# Patient Record
Sex: Male | Born: 1990 | Race: White | Hispanic: No | Marital: Single | Smoking: Current some day smoker
Health system: Southern US, Community
[De-identification: ages and names within clinical notes are randomized; demographics above are authoritative.]

## PROBLEM LIST (undated history)

## (undated) DIAGNOSIS — R55 Syncope and collapse: Secondary | ICD-10-CM

## (undated) DIAGNOSIS — F200 Paranoid schizophrenia: Secondary | ICD-10-CM

## (undated) DIAGNOSIS — F845 Asperger's syndrome: Secondary | ICD-10-CM

## (undated) DIAGNOSIS — F909 Attention-deficit hyperactivity disorder, unspecified type: Secondary | ICD-10-CM

## (undated) DIAGNOSIS — F32A Depression, unspecified: Secondary | ICD-10-CM

## (undated) DIAGNOSIS — F840 Autistic disorder: Secondary | ICD-10-CM

## (undated) DIAGNOSIS — F419 Anxiety disorder, unspecified: Secondary | ICD-10-CM

## (undated) HISTORY — PX: OTHER SURGICAL HISTORY: SHX169

## (undated) HISTORY — PX: NERVE SURGERY: SHX1016

## (undated) HISTORY — DX: Syncope and collapse: R55

## (undated) HISTORY — DX: Asperger's syndrome: F84.5

## (undated) HISTORY — DX: Attention-deficit hyperactivity disorder, unspecified type: F90.9

---
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
## 2009-11-03 ENCOUNTER — Ambulatory Visit: Payer: Self-pay | Admitting: Internal Medicine

## 2009-11-03 DIAGNOSIS — R21 Rash and other nonspecific skin eruption: Secondary | ICD-10-CM | POA: Insufficient documentation

## 2009-11-03 DIAGNOSIS — Z8619 Personal history of other infectious and parasitic diseases: Secondary | ICD-10-CM | POA: Insufficient documentation

## 2009-11-03 DIAGNOSIS — L0591 Pilonidal cyst without abscess: Secondary | ICD-10-CM | POA: Insufficient documentation

## 2009-11-06 ENCOUNTER — Encounter: Payer: Self-pay | Admitting: Internal Medicine

## 2010-10-03 DIAGNOSIS — F845 Asperger's syndrome: Secondary | ICD-10-CM

## 2010-10-03 HISTORY — DX: Asperger's syndrome: F84.5

## 2010-11-04 NOTE — Letter (Signed)
Summary: Communicable Disease Form/[facility] Dept  Communicable Disease Form/[facility]   Imported By: Lanelle Bal 11/11/2009 13:10:41  _____________________________________________________________________  External Attachment:    Type:   Image     Comment:   External Document

## 2010-11-04 NOTE — Assessment & Plan Note (Signed)
Summary: sores in mouth/kdc   Vital Signs:  Patient profile:   20 year old male Height:      67 inches Weight:      180 pounds BMI:     28.29 Pulse rate:   78 / minute BP sitting:   110 / 68  Vitals Entered By: Dena Billet CC: ov/sores in mouth Comments pt. states cyst on rectum for a yr. and wants to know if it needs to be lanced. pt. also thinks there is a boil x1wk that itches and causes pain pt. noticed 4 or 5 canker sores in moth x 3wks   History of Present Illness: several issues he has a long history of a pilonidal  cyst, it drains clear fluid from time to time  has a  rash close to the anus, for two weeks, extremely itchy  also 3 days ago and noted blister by the  glans in his penis  Allergies: No Known Drug Allergies  Past History:  Past Medical History: h/o  SYNCOPE 2008, was eval at the ER HYPERSOMNIA  ADHD   Social History: Reviewed history from 12/25/2007 and no changes required. live w/parents  finish HS currently working   Review of Systems       denies dysuria, fever or penile discharge he is sexually active, uses a condom  Physical Exam  General:  alert and well-developed.   Rectal:  the skin between his penis and the scrotum is quite red, thick, oozing  clear discharge Genitalia:  he has few clear fluid blisters by the glands in the ventral aspect of his penis.  No penile discharge Skin:  over the area of case citron, he has 3  1mm openings with some clear discharge, no fluctuancy   Impression & Recommendations:  Problem # 1:  several issues first episode of genital herpes: safe sex discussed, prescribed acyclovir, recommend HIV test and RPR rash between the anus and scrotum likely eczema/intertrigo: see Rx  pilonidal  cyst, my recommendation is to see a surgeon for a excision, currently he has no insurance, I will be happy to refer him anyway; to let me know when ready , states needs to d/w parents   Problem # 2:  RASH-NONVESICULAR  (ICD-782.1) see #1  His updated medication list for this problem includes:    Temovate 0.05 % Oint (Clobetasol propionate) .Marland Kitchen... 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 Apply twice a day for 10 days 2)  Acyclovir 200 Mg/73ml Susp (Acyclovir) .... One by mouth every 4 hours for 5 days  Other Orders: Venipuncture ([address]) T-HIV Antibody  (Reflex) ([id number]) T-RPR (Syphilis) ([id number]) Prescriptions: ACYCLOVIR 200 MG/5ML SUSP (ACYCLOVIR) one by mouth every 4 hours for 5 days  #30 x 0   Entered and Authorized by:   Nolon Rod. Paz MD   Signed by:   Nolon Rod. Paz MD on 11/03/2009   Method used:   Print then Give to Patient   RxID:   [id number] TEMOVATE 0.05 % OINT (CLOBETASOL PROPIONATE) apply twice a day for 10 days  #1 x 1   Entered and Authorized by:   Elita Quick E. Paz MD   Signed by:   Nolon Rod. Paz MD on 11/03/2009   Method used:   Print then Give to Patient   RxID:  [id number]  

## 2010-11-04 NOTE — Consult Note (Signed)
Summary: [facility]  [facility]   Imported By: Lanelle Bal 11/12/2009 16:03:54  _____________________________________________________________________  External Attachment:    Type:   Image     Comment:   External Document

## 2010-12-09 ENCOUNTER — Encounter: Payer: Self-pay | Admitting: Internal Medicine

## 2010-12-09 ENCOUNTER — Ambulatory Visit (INDEPENDENT_AMBULATORY_CARE_PROVIDER_SITE_OTHER): Payer: BC Managed Care – PPO | Admitting: Internal Medicine

## 2010-12-09 DIAGNOSIS — L0591 Pilonidal cyst without abscess: Secondary | ICD-10-CM

## 2010-12-10 ENCOUNTER — Encounter: Payer: Self-pay | Admitting: Internal Medicine

## 2010-12-14 NOTE — Assessment & Plan Note (Signed)
Summary: CYST ///SPH   Vital Signs:  Patient profile:   20 year old male Height:      67 inches Weight:      209.50 pounds BMI:     32.93 Pulse rate:   84 / minute Pulse rhythm:   regular BP sitting:   126 / 60  (left arm) Cuff size:   regular  Vitals Entered By: Army Fossa CMA (December 09, 2010 10:27 AM) CC: Pilonidal cyst Comments painful x 3 days [facility]    History of Present Illness:  developed pain  and some discharge  from the pilonidal cyst  3 days ago. discharge is brownish and bloody.  Review of systems  no fever There is no swelling   Current Medications (verified): 1)  None  Allergies (verified): No Known Drug Allergies  Past History:  Past Medical History: Reviewed history from 11/03/2009 and no changes required. h/o  SYNCOPE 2008, was eval at the ER HYPERSOMNIA  ADHD   Past Surgical History: no major surgeries   Physical Exam  General:  alert and well-developed.   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 Will treat current exacerbation with keflex Will refer to surgery for possible excision Patient will call if this  particular exacerbation gets worse, developed more swelling or fever  Orders: T-Culture, Wound ([id number]) Surgical Referral (Surgery)  Complete Medication List: 1)  Keflex 500 Mg Caps (Cephalexin) .Marland Kitchen.. 1 by mouth 4 times x 1 week Prescriptions: KEFLEX 500 MG CAPS (CEPHALEXIN) 1 by mouth 4 times x 1 week  #28 x 0   Entered and Authorized by:   Nolon Rod. Bradely Rudin MD   Signed by:   Nolon Rod. Cadon Raczka MD on 12/09/2010   Method used:   Print then Give to Patient   RxID:   [id number]    Orders Added: 1)  T-Culture, Wound  [[id number]] 2)  Est. Patient Level III [42706] 3)  Surgical Referral [Surgery]

## 2010-12-24 ENCOUNTER — Telehealth: Payer: Self-pay | Admitting: *Deleted

## 2010-12-24 MED ORDER — HYDROCODONE-ACETAMINOPHEN 5-500 MG PO TABS: 1.0000 | ORAL_TABLET | ORAL | Status: AC | PRN

## 2010-12-24 NOTE — Telephone Encounter (Signed)
Pts dad is aware. Sent in meds.

## 2010-12-24 NOTE — Telephone Encounter (Signed)
For pain tylenol or vicodin If pain severe, call vicodin 5mg  1 every 4 hours prn #30, no RF This is not a long term solution

## 2011-01-27 ENCOUNTER — Ambulatory Visit (INDEPENDENT_AMBULATORY_CARE_PROVIDER_SITE_OTHER): Payer: BC Managed Care – PPO | Admitting: Internal Medicine

## 2011-01-27 ENCOUNTER — Encounter: Payer: Self-pay | Admitting: Internal Medicine

## 2011-01-27 DIAGNOSIS — A6000 Herpesviral infection of urogenital system, unspecified: Secondary | ICD-10-CM

## 2011-01-27 DIAGNOSIS — F411 Generalized anxiety disorder: Secondary | ICD-10-CM

## 2011-01-27 DIAGNOSIS — F419 Anxiety disorder, unspecified: Secondary | ICD-10-CM | POA: Insufficient documentation

## 2011-01-27 DIAGNOSIS — F845 Asperger's syndrome: Secondary | ICD-10-CM | POA: Insufficient documentation

## 2011-01-27 DIAGNOSIS — R55 Syncope and collapse: Secondary | ICD-10-CM

## 2011-01-27 MED ORDER — SERTRALINE HCL 50 MG PO TABS: ORAL_TABLET | ORAL | Status: DC

## 2011-01-27 MED ORDER — ALPRAZOLAM 0.25 MG PO TABS: 0.2500 mg | ORAL_TABLET | Freq: Three times a day (TID) | ORAL | Status: DC | PRN

## 2011-01-27 NOTE — Assessment & Plan Note (Signed)
sx consistent with panic attacks. I recommend him to discuss that with his counselor. In addition I think SSRIs are a better option than Xanax. Start Zoloft. We'll prescribe Xanax only temporarily until SSRIs to start working. Followup in one month

## 2011-01-27 NOTE — Assessment & Plan Note (Signed)
Long history of ADHD, recently diagnosed with Asperger by a psychologist. I am concerned about him, he is 51 and has no college education. I think that see a psychiatrist  will be a great idea to see about treatment however the patient states that he has seen psychiatrists before and no particular recommendation was made.

## 2011-01-27 NOTE — Progress Notes (Signed)
  Subjective:    Patient ID: Shawn Berger, male    DOB: [birth year], 20 y.o.   MRN: [id number]  HPI Started on new job last week, developing panic attacks while at work. Symptoms are described as getting nervous, sweats, random thoughts; "like I don't know what to do". Similar symptoms on his 2 previous jobs. Also, recently was diagnosed with Asperger's syndrome.  Past Medical History  Diagnosis Date  . Syncope 2008    was eval at the ER  . ADHD (attention deficit hyperactivity disorder)    No past surgical history on file. History   Social History  . Marital Status: Single    Spouse Name: N/A    Number of Children: N/A  . Years of Education: N/A   Occupational History  . Not on file.   Social History Main Topics  . Smoking status: Not on file  . Smokeless tobacco: Not on file  . Alcohol Use: Not on file  . Drug Use: Not on file  . Sexually Active: Not on file   Other Topics Concern  . Not on file   Social History Narrative   Lives w/ his parents.Marland KitchenMarland KitchenMarland KitchenFinish HS, no college education....Marland KitchenMarland KitchenCurrently working      Review of Systems Denies depression, sleeps well,  other than at work, he has not had panic attacks or generalized anxiety.    Objective:   Physical Exam  Constitutional: He is oriented to person, place, and time. He appears well-developed and well-nourished. No distress.  Neurological: He is alert and oriented to person, place, and time.  Skin: He is not diaphoretic.  Psychiatric: He has a normal mood and affect. His behavior is normal. Judgment and thought content normal.          Assessment & Plan:

## 2011-03-14 ENCOUNTER — Other Ambulatory Visit: Payer: Self-pay | Admitting: Internal Medicine

## 2011-03-14 NOTE — Telephone Encounter (Signed)
LMOM on home # to call andd schedule appt with Dr Paz--(other number is Dad's number)

## 2011-03-14 NOTE — Telephone Encounter (Signed)
Please schedule an appt for pt, unable to fill meds until he has an appt.

## 2011-03-14 NOTE — Telephone Encounter (Signed)
Denied, due for OV

## 2011-03-15 ENCOUNTER — Other Ambulatory Visit: Payer: Self-pay | Admitting: Internal Medicine

## 2011-03-15 NOTE — Telephone Encounter (Signed)
Is this a duplicated? I think I already approved it

## 2011-03-16 ENCOUNTER — Encounter: Payer: Self-pay | Admitting: Internal Medicine

## 2011-03-16 ENCOUNTER — Ambulatory Visit (INDEPENDENT_AMBULATORY_CARE_PROVIDER_SITE_OTHER): Payer: BC Managed Care – PPO | Admitting: Internal Medicine

## 2011-03-16 DIAGNOSIS — F411 Generalized anxiety disorder: Secondary | ICD-10-CM

## 2011-03-16 DIAGNOSIS — F419 Anxiety disorder, unspecified: Secondary | ICD-10-CM

## 2011-03-16 MED ORDER — SERTRALINE HCL 50 MG PO TABS: ORAL_TABLET | ORAL | Status: DC

## 2011-03-16 MED ORDER — ALPRAZOLAM 0.25 MG PO TABS: 0.2500 mg | ORAL_TABLET | Freq: Three times a day (TID) | ORAL | Status: DC | PRN

## 2011-03-16 NOTE — Patient Instructions (Addendum)
Increase sertraline as prescribed, continue xanax as needed Call anytime if symptoms worse , suicidal ideas you need to be seen in a month either here or by a psychiatrist Consider Dr Jennelle Human or Marlyne Beards (see below) We sent #20 xanax today, call if more is needed      [facility] [address] [place], [place] [address]  Phone: [phone]  Fax: [phone]

## 2011-03-16 NOTE — Assessment & Plan Note (Signed)
Followup on anxiety, not doing better, more anxious and slightly depressed. No suicidal. Plan: Increase sertraline to 150 mg, continue with Xanax. He has a history of asperger syndrome, the treatment of his anxiety may be complicated. Recommend to see a psychiatrist. The father states they will call for an appointment, information provided. See instructions.

## 2011-03-16 NOTE — Telephone Encounter (Signed)
Has not been approved since April 26 for # 20.

## 2011-03-16 NOTE — Telephone Encounter (Signed)
Ok #20, needs OV before further RF.

## 2011-03-16 NOTE — Progress Notes (Signed)
  Subjective:    Patient ID: Shawn Berger, male    DOB: Feb 27, 1991, 20 y.o.   MRN: [id number]  HPI Followup from last office visit, here with his father. He developed a panic attack after he  started a new job at [employer].  He was started on sertraline and Xanax. Symptoms are actually worse, anxious not only at his job but also in other settings. They are more frequent. Again described the symptoms as a sudden anxiety associated with feeling hot and sweats. Symptoms last 5 minutes.  Past Medical History  Diagnosis Date  . Syncope 2008    was eval at the ER  . ADHD (attention deficit hyperactivity disorder)    No past surgical history on file.    Review of Systems In the last few weeks he has also developed some depression and feeling the need of crying. Denies suicidal ideas. He told his father that everything is going well in his life and he can't understand why he is feeling this way. Denies the use of alcohol, he smokes tobacco half pack a day, no drugs except occasional marijuana. Last time he smoked pot about a week ago .    Objective:   Physical Exam Alert, oriented, in no apparent distress. Coherent and cooperative. No obvious evidence of anxiety or depression.        Assessment & Plan:

## 2011-03-16 NOTE — Telephone Encounter (Signed)
Pt has appt today

## 2011-03-29 NOTE — Telephone Encounter (Signed)
Patient had appt on 6/13

## 2011-04-11 ENCOUNTER — Other Ambulatory Visit: Payer: Self-pay | Admitting: Internal Medicine

## 2011-07-19 ENCOUNTER — Other Ambulatory Visit: Payer: Self-pay | Admitting: Internal Medicine

## 2011-07-19 MED ORDER — ALPRAZOLAM 0.25 MG PO TABS: 0.2500 mg | ORAL_TABLET | Freq: Three times a day (TID) | ORAL | Status: DC | PRN

## 2011-07-19 MED ORDER — ALPRAZOLAM 0.25 MG PO TABS: 0.2500 mg | ORAL_TABLET | Freq: Three times a day (TID) | ORAL | Status: AC | PRN

## 2011-07-19 NOTE — Telephone Encounter (Signed)
Done

## 2011-07-19 NOTE — Telephone Encounter (Signed)
Ok #20, no RF Also he was recommended to see psychiatry, I won't be able to RF meds long term

## 2011-07-19 NOTE — Telephone Encounter (Signed)
Alprazolam request [last refill 03/16/11 #20x0]

## 2011-07-19 NOTE — Telephone Encounter (Signed)
Addended by: Regis Bill on: 07/19/2011 01:27 PM   Modules accepted: Orders

## 2012-05-07 ENCOUNTER — Other Ambulatory Visit: Payer: Self-pay | Admitting: Internal Medicine

## 2012-05-07 NOTE — Telephone Encounter (Signed)
Pt has not been seen recently...ok to refill?

## 2012-05-07 NOTE — Telephone Encounter (Signed)
Not seen in a year, he was recommended to see psychiatry. Unable to refill at this time.

## 2012-05-07 NOTE — Telephone Encounter (Signed)
ALPRAZOLAM 0.25 MG TABLET  LAST FILLED: 05/07/12 TAKE 1 TABLET 3 TIMES A DAY AS NEEDED FOR ANXIETY

## 2012-05-08 NOTE — Telephone Encounter (Signed)
Tried calling to notify pt but do not have the correct phone #.

## 2015-07-29 ENCOUNTER — Ambulatory Visit: Payer: Self-pay | Admitting: Internal Medicine

## 2015-07-29 ENCOUNTER — Telehealth: Payer: Self-pay | Admitting: Internal Medicine

## 2015-07-29 ENCOUNTER — Other Ambulatory Visit: Payer: Self-pay

## 2015-07-29 NOTE — Telephone Encounter (Signed)
Caller name: Ernie   Relationship to patient: Father   Can be reached: [phone]  Reason for call: Pt's father called in at 12:04 and lvm stating that he is cancelling pt's appt due to his son not being able to get off work. He also wanted to know if Dr. Drue NovelPaz have a 4:00 appt any day before Friday. Returned pt's father's call at 1:15 confirming appt cancellation and also making him aware that provider doesn't have any appts at 4:00 as needed.

## 2015-07-31 NOTE — Telephone Encounter (Signed)
Father called stating he was running 5 minutes late. I went to notify front desk and pt appt had been cancelled. [place] walked back to [facility].

## 2016-03-11 ENCOUNTER — Ambulatory Visit: Payer: Self-pay | Admitting: Internal Medicine

## 2016-03-11 ENCOUNTER — Telehealth: Payer: Self-pay | Admitting: Internal Medicine

## 2016-03-11 NOTE — Telephone Encounter (Signed)
-----   Message from Hampton BaysKaylyn Canter, [place] sent at 03/11/2016  8:18 AM EDT ----- Regarding: RE: Re-Establish (had cancellation for 6/9) He said okay to use 3:30 slot  ----- Message -----    From: Maia PettiesKristie S Ortiz    Sent: 03/11/2016   8:13 AM      To: Conrad BurlingtonKaylyn Canter, CMA Subject: FW: Re-Establish (had cancellation for 6/9)    Can you ask Dr. Drue NovelPaz if this is ok for today? ----- Message -----    From: Maia PettiesKristie S Ortiz    Sent: 03/10/2016   4:05 PM      To: Maia PettiesKristie S Ortiz Subject: FW: Re-Establish (had cancellation for 6/9)      ----- Message -----    From: Maia PettiesKristie S Ortiz    Sent: 03/10/2016   4:04 PM      To: Wanda PlumpJose E Paz, MD Subject: Re-Establish (had cancellation for 6/9)        Is it ok to re-establish Barnett Abuathan Vanhandel, DOB 1991/05/28, MRN [id number] - he is special needs adult child, last OV 03/2011

## 2016-03-14 NOTE — Telephone Encounter (Signed)
Second  No-show, no charge, do not rescheduled

## 2016-03-14 NOTE — Telephone Encounter (Signed)
This is Pt's second no show for re-establish. Charge? No Charge? Allow to re-establish?

## 2016-03-14 NOTE — Telephone Encounter (Signed)
Pt was no show for re-est appt. Pt has not rescheduled. Charge or no charge? Allow to re-est in future?

## 2016-04-13 ENCOUNTER — Ambulatory Visit: Payer: BLUE CROSS/BLUE SHIELD | Admitting: Family Medicine

## 2016-04-28 ENCOUNTER — Encounter: Payer: Self-pay | Admitting: Family Medicine

## 2016-04-28 ENCOUNTER — Other Ambulatory Visit (HOSPITAL_COMMUNITY)
Admission: RE | Admit: 2016-04-28 | Discharge: 2016-04-28 | Disposition: A | Discharge status: Home / Self Care | Payer: BLUE CROSS/BLUE SHIELD | Source: Ambulatory Visit | Attending: Family Medicine | Admitting: Family Medicine

## 2016-04-28 ENCOUNTER — Ambulatory Visit (INDEPENDENT_AMBULATORY_CARE_PROVIDER_SITE_OTHER): Payer: BLUE CROSS/BLUE SHIELD | Admitting: Family Medicine

## 2016-04-28 VITALS — BP 120/72 | HR 84 | Temp 97.7°F | Ht 67.75 in | Wt 152.2 lb

## 2016-04-28 DIAGNOSIS — Z113 Encounter for screening for infections with a predominantly sexual mode of transmission: Secondary | ICD-10-CM | POA: Diagnosis present

## 2016-04-28 DIAGNOSIS — F411 Generalized anxiety disorder: Secondary | ICD-10-CM | POA: Diagnosis not present

## 2016-04-28 DIAGNOSIS — Z8619 Personal history of other infectious and parasitic diseases: Secondary | ICD-10-CM

## 2016-04-28 DIAGNOSIS — F321 Major depressive disorder, single episode, moderate: Secondary | ICD-10-CM | POA: Insufficient documentation

## 2016-04-28 DIAGNOSIS — Z7251 High risk heterosexual behavior: Secondary | ICD-10-CM | POA: Diagnosis not present

## 2016-04-28 LAB — COMPREHENSIVE METABOLIC PANEL
ALT: 13 U/L (ref 0–53)
AST: 13 U/L (ref 0–37)
Albumin: 4.4 g/dL (ref 3.5–5.2)
Alkaline Phosphatase: 59 U/L (ref 39–117)
BILIRUBIN TOTAL: 0.6 mg/dL (ref 0.2–1.2)
BUN: 14 mg/dL (ref 6–23)
CO2: 28 meq/L (ref 19–32)
CREATININE: 0.9 mg/dL (ref 0.40–1.50)
Calcium: 9.6 mg/dL (ref 8.4–10.5)
Chloride: 104 mEq/L (ref 96–112)
GFR: 107.92 mL/min (ref >60.00)
Glucose, Bld: 97 mg/dL (ref 70–99)
Potassium: 4.3 mEq/L (ref 3.5–5.1)
SODIUM: 137 meq/L (ref 135–145)
TOTAL PROTEIN: 7 g/dL (ref 6.0–8.3)

## 2016-04-28 LAB — CBC
HEMATOCRIT: 48.3 % (ref 39.0–52.0)
HEMOGLOBIN: 16.2 g/dL (ref 13.0–17.0)
MCHC: 33.6 g/dL (ref 30.0–36.0)
MCV: 89.5 fl (ref 78.0–100.0)
Platelets: 247 10*3/uL (ref 150.0–400.0)
RBC: 5.4 Mil/uL (ref 4.22–5.81)
RDW: 14 % (ref 11.5–15.5)
WBC: 7.8 10*3/uL (ref 4.0–10.5)

## 2016-04-28 LAB — TSH: TSH: 0.89 u[IU]/mL (ref 0.35–4.50)

## 2016-04-28 MED ORDER — CITALOPRAM HYDROBROMIDE 10 MG PO TABS: 10.0000 mg | ORAL_TABLET | Freq: Every day | ORAL | 3 refills | Status: DC

## 2016-04-28 NOTE — Progress Notes (Signed)
Phone: [phone]  Subjective:  Patient presents today to establish care. Chief complaint-noted.   See problem oriented charting  The following were reviewed and entered/updated in epic: Past Medical History:  Diagnosis Date  . ADHD (attention deficit hyperactivity disorder)   . Asperger syndrome 2012  . Syncope 2008   was eval at the ER   Patient Active Problem List   Diagnosis Date Noted  . Attention deficit hyperactivity disorder (ADHD) 07/04/2007    Priority: High  . Moderate single current episode of major depressive disorder (HCC) 04/28/2016    Priority: Medium  . GAD (generalized anxiety disorder) 04/28/2016    Priority: Medium  . Asperger syndrome 01/27/2011    Priority: Medium  . Anxiety 01/27/2011    Priority: Medium  . History of syphilis 11/03/2009    Priority: Low  . SYMPTOM, SYNCOPE AND COLLAPSE 07/04/2007    Priority: Low   Past Surgical History:  Procedure Laterality Date  . none      Family History  Problem Relation Age of Onset  . Healthy Mother   . Healthy Father   . Healthy Sister   . Vitiligo Brother   . Healthy Sister     Medications- reviewed and updated No current outpatient prescriptions on file.   No current facility-administered medications for this visit.     Allergies-reviewed and updated No Known Allergies  Social History   Social History  . Marital status: Single    Spouse name: N/A  . Number of children: N/A  . Years of education: N/A   Social History Main Topics  . Smoking status: Current Every Day Smoker    Packs/day: 0.50    Types: Cigarettes  . Smokeless tobacco: None  . Alcohol use 0.6 oz/week    1 Shots of liquor per week  . Drug use:     Types: Marijuana  . Sexual activity: Yes    Birth control/ protection: Condom   Other Topics Concern  . None   Social History Narrative   Dating. Lives w/ his parents.       Finished HS, no college education.    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 On feet 10-11 hours.       Hobbies: enjoys time outside, walking around woods for example    ROS--Full ROS was completed Review of Systems  Constitutional: Negative for chills and fever.  HENT: Negative for hearing loss.   Eyes: Negative for blurred vision and double vision.  Respiratory: Negative for cough and hemoptysis.   Cardiovascular: Negative for chest pain and palpitations.  Gastrointestinal: Positive for constipation. Negative for heartburn and nausea.  Genitourinary: Negative for dysuria and urgency.  Musculoskeletal: Negative for myalgias and neck pain.  Skin: Negative for itching and rash.  Neurological: Negative for dizziness, tingling and headaches.  Endo/Heme/Allergies: Negative for polydipsia. Does not bruise/bleed easily.  Psychiatric/Behavioral: Positive for substance abuse (marijuana). Negative for suicidal ideas. The patient is nervous/anxious.     Objective: BP 120/72 (BP Location: Left Arm, Patient Position: Sitting, Cuff Size: Normal)   Pulse 84   Temp 97.7 F (36.5 C) (Oral)   Ht 5' 7.75" (1.721 m)   Wt 152 lb 3.2 oz (69 kg)   SpO2 98%   BMI 23.31 kg/m  Gen: NAD, resting comfortably HEENT: Mucous membranes are moist. Oropharynx normal. TM normal. Eyes: sclera and lids normal, PERRLA Neck: no thyromegaly, no cervical lymphadenopathy CV: RRR no murmurs rubs or gallops  Lungs: CTAB no crackles, wheeze, rhonchi Abdomen: soft/nontender/nondistended/normal bowel sounds. No rebound or guarding.  Ext: no edema Skin: warm, dry Neuro: 5/5 strength in upper and lower extremities, normal gait, normal reflexes  Assessment/Plan:  Eats once a day. Eats 10-11 hours then eats at 8 o clock at night. Feels hungry but eats something but takes effort to swallow it. No true dysphagia. Advised 3 meals a day even if first two are small and work his way up  Tobacco- advised to quit. He may start by trying to vape- quit for 2  years previously  History syphilis treated HD, history unprotected sex though currently using condoms- check STD labs  GAD (generalized anxiety disorder) Depression major S: Anxiety main issue with GAD of 17 and rates very difficult. His anxiety has worsened some from prior noted. No longer on zoloft. Does not remember how much it helped. Not sure about other SSRI but does not think class helped much. No longer seeing psychology or psychiatry. Depression was a concern given sleep issue. Falls to sleep for 4 hours then wakes up, does not wake up refreshed. Feels tired all day. PHQ9 of 15 with no SI/HI.  A/P: update labs. Also start citalopram 10mg  with 4 week follow up . See avs instructions.    Return in about 4 weeks (around 05/26/2016).   Orders Placed This Encounter  Procedures  . CBC    [place]  . Comprehensive metabolic panel    [place]  . TSH    [place]  . RPR    [facility]  . HIV antibody    [facility]    Meds ordered this encounter  Medications  . citalopram (CELEXA) 10 MG tablet    Sig: Take 1 tablet (10 mg total) by mouth daily.    Dispense:  30 tablet    Refill:  3    Return precautions advised.  Tana Conch, MD

## 2016-04-28 NOTE — Progress Notes (Signed)
Pre visit review using our clinic review tool, if applicable. No additional management support is needed unless otherwise documented below in the visit note. 

## 2016-04-28 NOTE — Patient Instructions (Addendum)
Labs before you go  Pee directly into cup without cleaning your penis for this test  Start citalopram 10mg  daily. Follow up 4 weeks. Schedule this today at check out. This is to treat both anxiety and depression  Encourage you to quit smoking. Marijuana can affect depression so I would advise against.   Eat 3 meals a day even if small meal   Taking the medicine as directed and not missing any doses is one of the best things you can do to treat your depression.  Here are some things to keep in mind:  1) Side effects (stomach upset, some increased anxiety) may happen before you notice a benefit.  These side effects typically go away over time. 2) Changes to your dose of medicine or a change in medication all together is sometimes necessary 3) Most people need to be on medication at least 6-12 months 4) Many people will notice an improvement within two weeks but the full effect of the medication can take up to 4-6 weeks 5) Stopping the medication when you start feeling better often results in a return of symptoms 6) If you start having thoughts of hurting yourself or others after starting this medicine, call our office immediately at [phone] or seek care through 911.

## 2016-04-28 NOTE — Assessment & Plan Note (Signed)
Depression major S: Anxiety main issue with GAD of 17 and rates very difficult. His anxiety has worsened some from prior noted. No longer on zoloft. Does not remember how much it helped. Not sure about other SSRI but does not think class helped much. No longer seeing psychology or psychiatry. Depression was a concern given sleep issue. Falls to sleep for 4 hours then wakes up, does not wake up refreshed. Feels tired all day. PHQ9 of 15 with no SI/HI.  A/P: update labs. Also start citalopram 10mg  with 4 week follow up . See avs instructions.

## 2016-04-29 LAB — URINE CYTOLOGY ANCILLARY ONLY
Chlamydia: NEGATIVE
NEISSERIA GONORRHEA: NEGATIVE
TRICH (WINDOWPATH): NEGATIVE

## 2016-04-29 LAB — RPR

## 2016-04-29 LAB — HIV ANTIBODY (ROUTINE TESTING W REFLEX): HIV 1&2 Ab, 4th Generation: NONREACTIVE

## 2016-05-26 ENCOUNTER — Ambulatory Visit (INDEPENDENT_AMBULATORY_CARE_PROVIDER_SITE_OTHER): Payer: BLUE CROSS/BLUE SHIELD | Admitting: Family Medicine

## 2016-05-26 ENCOUNTER — Encounter: Payer: Self-pay | Admitting: Family Medicine

## 2016-05-26 VITALS — BP 110/62 | HR 90 | Temp 98.0°F | Wt 148.0 lb

## 2016-05-26 DIAGNOSIS — F411 Generalized anxiety disorder: Secondary | ICD-10-CM

## 2016-05-26 DIAGNOSIS — F321 Major depressive disorder, single episode, moderate: Secondary | ICD-10-CM

## 2016-05-26 MED ORDER — CITALOPRAM HYDROBROMIDE 20 MG PO TABS: 20.0000 mg | ORAL_TABLET | Freq: Every day | ORAL | 5 refills | Status: DC

## 2016-05-26 NOTE — Progress Notes (Signed)
Pre visit review using our clinic review tool, if applicable. No additional management support is needed unless otherwise documented below in the visit note. 

## 2016-05-26 NOTE — Assessment & Plan Note (Signed)
Depression  GAD S: last visit diagnosed with GAD and depression. GAD7 was 17 and rates 17 which was primary issue. PHQ9 was 15 with no SI/HI.  He had been on zoloft in past but was not sure how much it helped. He thinks may have tried other SSRI. We started patient on citalopram 10mg  with plans for 4 week follow up  Today, both GAD7 and PHQ9 are down to 8. GAD rated as not difficult at all and depression rated somewhat difficult. May be some overlap here as he statesfeeling tired is an issue for him but he told me that fatigue was main SE when starting medicine and now improving  A/P: improving but still poor control, titrate to 20mg  celexa. Hopeful SE of fatigue is tolerable. Return visit 6 weeks. Strict SI precautions given.

## 2016-05-26 NOTE — Patient Instructions (Signed)
Increase celexa to 20mg   Return in 6 weeks  Any thoughts of hurting yourself call us immediately  Thrilled you are feeling better! There is one more potential adjustment to this medication but my hopes are we do not need to do that.

## 2016-05-26 NOTE — Progress Notes (Signed)
Subjective:  Shawn Berger is a 25 y.o. year old very pleasant male patient who presents for/with See problem oriented charting ROS- no SI/HI. Some anxiety, denies recent depressed mood.see any ROS included in HPI as well.   Past Medical History-  Patient Active Problem List   Diagnosis Date Noted  . Attention deficit hyperactivity disorder (ADHD) 07/04/2007    Priority: High  . Moderate single current episode of major depressive disorder (HCC) 04/28/2016    Priority: Medium  . GAD (generalized anxiety disorder) 04/28/2016    Priority: Medium  . Asperger syndrome 01/27/2011    Priority: Medium  . Anxiety 01/27/2011    Priority: Medium  . History of syphilis 11/03/2009    Priority: Low  . SYMPTOM, SYNCOPE AND COLLAPSE 07/04/2007    Priority: Low    Medications- reviewed and updated Current Outpatient Prescriptions  Medication Sig Dispense Refill  . citalopram (CELEXA) 10 MG tablet Take 1 tablet (10 mg total) by mouth daily. 30 tablet 3   No current facility-administered medications for this visit.     Objective: BP 110/62 (BP Location: Left Arm, Patient Position: Sitting, Cuff Size: Normal)   Pulse 90   Temp 98 F (36.7 C) (Oral)   Wt 148 lb (67.1 kg)   SpO2 96%   BMI 22.67 kg/m  Gen: NAD, resting comfortably Non depressed, non axious mood  Assessment/Plan:  GAD (generalized anxiety disorder) Depression  S: last visit diagnosed with GAD and depression. GAD7 was 17 and rates 17 which was primary issue. PHQ9 was 15 with no SI/HI.  He had been on zoloft in past but was not sure how much it helped. He thinks may have tried other SSRI. We started patient on citalopram 10mg  with plans for 4 week follow up  Today, both GAD7 and PHQ9 are down to 8. GAD rated as not difficult at all and depression rated somewhat difficult. May be some overlap here as he statesfeeling tired is an issue for him but he told me that fatigue was main SE when starting medicine and now  improving  A/P: improving but still poor control, titrate to 20mg  celexa. Hopeful SE of fatigue is tolerable. Return visit 6 weeks. Strict SI precautions given.   6 weeks  Meds ordered this encounter  Medications  . citalopram (CELEXA) 20 MG tablet    Sig: Take 1 tablet (20 mg total) by mouth daily.    Dispense:  30 tablet    Refill:  5   Return precautions advised.  Tana ConchStephen Januel Doolan, MD

## 2016-07-11 ENCOUNTER — Encounter: Payer: Self-pay | Admitting: Family Medicine

## 2016-07-11 ENCOUNTER — Ambulatory Visit (INDEPENDENT_AMBULATORY_CARE_PROVIDER_SITE_OTHER): Payer: BLUE CROSS/BLUE SHIELD | Admitting: Family Medicine

## 2016-07-11 VITALS — BP 128/70 | HR 99 | Temp 98.2°F | Wt 156.0 lb

## 2016-07-11 DIAGNOSIS — F321 Major depressive disorder, single episode, moderate: Secondary | ICD-10-CM | POA: Diagnosis not present

## 2016-07-11 DIAGNOSIS — F411 Generalized anxiety disorder: Secondary | ICD-10-CM

## 2016-07-11 DIAGNOSIS — J36 Peritonsillar abscess: Secondary | ICD-10-CM

## 2016-07-11 MED ORDER — AMOXICILLIN-POT CLAVULANATE 875-125 MG PO TABS: 1.0000 | ORAL_TABLET | Freq: Two times a day (BID) | ORAL | 0 refills | Status: DC

## 2016-07-11 MED ORDER — CITALOPRAM HYDROBROMIDE 40 MG PO TABS: 40.0000 mg | ORAL_TABLET | Freq: Every day | ORAL | 5 refills | Status: DC

## 2016-07-11 NOTE — Patient Instructions (Addendum)
Increase celexa to 40mg . Follow up 6 weeks. If thoughts of hurting yourself- contact us immediately  I am concerned for peritonsilar abscess with swelling in soft tissues near your tonsil and pus that is evident only on left side. I am treating you for 2 weeks with augmentin. I want you to come back for a recheck Thursday at 11 30 AM if possible

## 2016-07-11 NOTE — Assessment & Plan Note (Signed)
Depression S: GAD7 17 and PH9 of 15 initially down to 8 for both on last visit with celexa 10mg . Further titrated to 20mg  and PHQ9 of 6 today and GAD7 down to 5. Would like further improvement in symptoms. He admits fatigue issues could be linked to prior diagnosis of narcolepsy at age 25 apparently - he has never been on treatment. No worsening of fatigue on the 20mg  celexa which was prior concern of his at 10mg .  A/P:  Titrate to 40mg . No SI- return precautions given in regards to this. Follow up 6 weeks.

## 2016-07-11 NOTE — Progress Notes (Signed)
Pre visit review using our clinic review tool, if applicable. No additional management support is needed unless otherwise documented below in the visit note. 

## 2016-07-11 NOTE — Progress Notes (Signed)
Subjective:  Shawn Berger is a 25 y.o. year old very pleasant male patient who presents for/with See problem oriented charting ROS- no fever, chills. Has had some nausea and severe sore throat on left only. .see any ROS included in HPI as well.   Past Medical History-  Patient Active Problem List   Diagnosis Date Noted  . Attention deficit hyperactivity disorder (ADHD) 07/04/2007    Priority: High  . Moderate single current episode of major depressive disorder (HCC) 04/28/2016    Priority: Medium  . GAD (generalized anxiety disorder) 04/28/2016    Priority: Medium  . Asperger syndrome 01/27/2011    Priority: Medium  . Anxiety 01/27/2011    Priority: Medium  . History of syphilis 11/03/2009    Priority: Low  . SYMPTOM, SYNCOPE AND COLLAPSE 07/04/2007    Priority: Low    Medications- reviewed and updated Current Outpatient Prescriptions  Medication Sig Dispense Refill  . citalopram (CELEXA) 40 MG tablet Take 1 tablet (40 mg total) by mouth daily. 30 tablet 5  . amoxicillin-clavulanate (AUGMENTIN) 875-125 MG tablet Take 1 tablet by mouth 2 (two) times daily. 28 tablet 0   No current facility-administered medications for this visit.     Objective: BP 128/70 (BP Location: Left Arm, Patient Position: Sitting, Cuff Size: Large)   Pulse 99   Temp 98.2 F (36.8 C) (Oral)   Wt 156 lb (70.8 kg)   SpO2 97%   BMI 23.90 kg/m  Gen: NAD, resting comfortably Poor dentition but gumline appears clean. There is swelling of soft palate on left. The uvula is to the right of midline. Tonsil visibly enlarged on right,erythematous and with central purulent material.  CV: RRR no murmurs rubs or gallops Lungs: CTAB no crackles, wheeze, rhonchi Abdomen: soft/nontender/nondistended/normal bowel sounds.  Ext: no edema Skin: warm, dry Neuro: grossly normal, moves all extremities      Assessment/Plan:   GAD (generalized anxiety disorder) Depression S: GAD7 17 and PH9 of 15 initially  down to 8 for both on last visit with celexa 10mg . Further titrated to 20mg  and PHQ9 of 6 today and GAD7 down to 5. Would like further improvement in symptoms. He admits fatigue issues could be linked to prior diagnosis of narcolepsy at age 49 apparently - he has never been on treatment. No worsening of fatigue on the 20mg  celexa which was prior concern of his at 10mg .  A/P:  Titrate to 40mg . No SI- return precautions given in regards to this. Follow up 6 weeks.   Peritonsilar abscess S: deep gum cleaning about a week ago and since that time has had progressive left sided throat pain which has made it hard to eat almost anything and also hard to drink. Hot or cold both bother him. afebrile A/P: Appears to be potential abscess on exam. Cover 14 days of augmentin with follow up after 48 hours- will also call dentist. If no improvement may consider oral surgery referral for I+D. This will also cover strep but given bulge in soft palate and deviation of uvula slightly- strongly suspect this is abscess and not just strep throat related.   Meds ordered this encounter  Medications  . citalopram (CELEXA) 40 MG tablet    Sig: Take 1 tablet (40 mg total) by mouth daily.    Dispense:  30 tablet    Refill:  5  . amoxicillin-clavulanate (AUGMENTIN) 875-125 MG tablet    Sig: Take 1 tablet by mouth 2 (two) times daily.    Dispense:  28 tablet    Refill:  0  abscess high risk with close follow up advised  Return precautions advised.  Tana ConchStephen Hunter, MD

## 2016-07-14 ENCOUNTER — Ambulatory Visit: Payer: BLUE CROSS/BLUE SHIELD | Admitting: Family Medicine

## 2016-08-19 ENCOUNTER — Encounter: Payer: Self-pay | Admitting: Family Medicine

## 2016-08-19 ENCOUNTER — Ambulatory Visit (INDEPENDENT_AMBULATORY_CARE_PROVIDER_SITE_OTHER): Payer: BLUE CROSS/BLUE SHIELD | Admitting: Family Medicine

## 2016-08-19 DIAGNOSIS — F411 Generalized anxiety disorder: Secondary | ICD-10-CM

## 2016-08-19 DIAGNOSIS — L0501 Pilonidal cyst with abscess: Secondary | ICD-10-CM | POA: Insufficient documentation

## 2016-08-19 MED ORDER — CITALOPRAM HYDROBROMIDE 40 MG PO TABS: 40.0000 mg | ORAL_TABLET | Freq: Every day | ORAL | 2 refills | Status: DC

## 2016-08-19 NOTE — Progress Notes (Signed)
Pre visit review using our clinic review tool, if applicable. No additional management support is needed unless otherwise documented below in the visit note. 

## 2016-08-19 NOTE — Progress Notes (Signed)
Subjective:  Shawn Berger is a 25 y.o. year old very pleasant male patient who presents for/with See problem oriented charting ROS- No Si/HI. No fever, chills. Did have some discharge from abscess- shrinking in size now.see any ROS included in HPI as well.   Past Medical History-  Patient Active Problem List   Diagnosis Date Noted  . Attention deficit hyperactivity disorder (ADHD) 07/04/2007    Priority: High  . Moderate single current episode of major depressive disorder (HCC) 04/28/2016    Priority: Medium  . GAD (generalized anxiety disorder) 04/28/2016    Priority: Medium  . Asperger syndrome 01/27/2011    Priority: Medium  . Anxiety 01/27/2011    Priority: Medium  . History of syphilis 11/03/2009    Priority: Low  . SYMPTOM, SYNCOPE AND COLLAPSE 07/04/2007    Priority: Low  . Pilonidal cyst with abscess 08/19/2016    Medications- reviewed and updated Current Outpatient Prescriptions  Medication Sig Dispense Refill  . citalopram (CELEXA) 40 MG tablet Take 1 tablet (40 mg total) by mouth daily. 90 tablet 2   No current facility-administered medications for this visit.     Objective: BP 112/62 (BP Location: Left Arm, Patient Position: Sitting, Cuff Size: Large)   Pulse 85   Temp 98.2 F (36.8 C) (Oral)   Wt 167 lb 6.4 oz (75.9 kg)   SpO2 97%   BMI 25.64 kg/m  Gen: NAD, resting comfortably CV: RRR no murmurs rubs or gallops Lungs: CTAB no crackles, wheeze, rhonchi Abdomen: soft/nontender/nondistended/normal bowel sounds. No rebound or guarding.  Rectal: pit near top of buttocks crease- then has some induration without obvious fluctuance down at least 3 cm connecting with a 3 x 3 cm abscess without obvious fluctuance- no expanding redness  Assessment/Plan:   Pilonidal cyst with abscess S:pilonidal cyst started bothering him teenage years. Had opted out of surgery before. Been going on for a week at least- had a friend "pop it" with "sterilized thumb tack". Oozed  for a few days and finally stopped yesterday. Improving at this point.  A/P: refer to surgery- Wants to discuss excision option. Apparently had been to [facility] years ago- opted out due to 50% recurrence risk per patient- he now has abscess formation every 4-5 months and would like to consider surgery again. Advised to come in and not used "sterilized" objects at home. Abscess now but improving per patient so we will monitor only  GAD (generalized anxiety disorder) S: feels much better on the celexa 40mg . Still some fatigue with it he thinks though. PHQ9 and GAD7 of 2 with no SI/HI.  A/P: excellent control- continue current meds- 6 month cpe advised. Changed to 90 day supply   Orders Placed This Encounter  Procedures  . Ambulatory referral to General Surgery    Referral Priority:   Routine    Referral Type:   Surgical    Referral Reason:   Specialty Services Required    Requested Specialty:   General Surgery    Number of Visits Requested:   1    Meds ordered this encounter  Medications  . citalopram (CELEXA) 40 MG tablet    Sig: Take 1 tablet (40 mg total) by mouth daily.    Dispense:  90 tablet    Refill:  2    Return precautions advised.  Tana ConchStephen Ebonie Westerlund, MD

## 2016-08-19 NOTE — Assessment & Plan Note (Signed)
S:pilonidal cyst started bothering him teenage years. Had opted out of surgery before. Been going on for a week at least- had a friend "pop it" with "sterilized thumb tack". Oozed for a few days and finally stopped yesterday. Improving at this point.  A/P: refer to surgery- Wants to discuss excision option. Apparently had been to [facility] years ago- opted out due to 50% recurrence risk per patient- he now has abscess formation every 4-5 months and would like to consider surgery again. Advised to come in and not used "sterilized" objects at home. Abscess now but improving per patient so we will monitor only

## 2016-08-19 NOTE — Patient Instructions (Signed)
We will call you within a week about your referral to surgery. If you do not hear within 2 weeks, give us a call.   If new or worsening symptoms- return to see us. Opted to watch given you are doing better.   Sent in 90 day prescription for celexa 40mg   How about we do physical in 6 months to check in

## 2016-08-20 NOTE — Assessment & Plan Note (Signed)
S: feels much better on the celexa 40mg . Still some fatigue with it he thinks though. PHQ9 and GAD7 of 2 with no SI/HI.  A/P: excellent control- continue current meds- 6 month cpe advised. Changed to 90 day supply

## 2016-08-23 ENCOUNTER — Ambulatory Visit: Payer: BLUE CROSS/BLUE SHIELD | Admitting: Family Medicine

## 2018-06-01 ENCOUNTER — Ambulatory Visit: Payer: BLUE CROSS/BLUE SHIELD | Admitting: Family Medicine

## 2018-06-07 ENCOUNTER — Encounter: Payer: Self-pay | Admitting: Family Medicine

## 2018-06-07 ENCOUNTER — Other Ambulatory Visit (HOSPITAL_COMMUNITY)
Admission: RE | Admit: 2018-06-07 | Discharge: 2018-06-07 | Disposition: A | Discharge status: Home / Self Care | Payer: Self-pay | Source: Ambulatory Visit | Attending: Family Medicine | Admitting: Family Medicine

## 2018-06-07 ENCOUNTER — Ambulatory Visit (INDEPENDENT_AMBULATORY_CARE_PROVIDER_SITE_OTHER): Payer: Self-pay | Admitting: Family Medicine

## 2018-06-07 VITALS — BP 136/72 | HR 93 | Temp 97.9°F | Wt 163.8 lb

## 2018-06-07 DIAGNOSIS — R5383 Other fatigue: Secondary | ICD-10-CM

## 2018-06-07 DIAGNOSIS — Z118 Encounter for screening for other infectious and parasitic diseases: Secondary | ICD-10-CM | POA: Insufficient documentation

## 2018-06-07 DIAGNOSIS — Z113 Encounter for screening for infections with a predominantly sexual mode of transmission: Secondary | ICD-10-CM | POA: Insufficient documentation

## 2018-06-07 DIAGNOSIS — Z1159 Encounter for screening for other viral diseases: Secondary | ICD-10-CM

## 2018-06-07 DIAGNOSIS — F329 Major depressive disorder, single episode, unspecified: Secondary | ICD-10-CM

## 2018-06-07 DIAGNOSIS — Z818 Family history of other mental and behavioral disorders: Secondary | ICD-10-CM

## 2018-06-07 DIAGNOSIS — G4710 Hypersomnia, unspecified: Secondary | ICD-10-CM

## 2018-06-07 DIAGNOSIS — R4589 Other symptoms and signs involving emotional state: Secondary | ICD-10-CM

## 2018-06-07 DIAGNOSIS — G47419 Narcolepsy without cataplexy: Secondary | ICD-10-CM | POA: Insufficient documentation

## 2018-06-07 DIAGNOSIS — F419 Anxiety disorder, unspecified: Secondary | ICD-10-CM

## 2018-06-07 DIAGNOSIS — Z114 Encounter for screening for human immunodeficiency virus [HIV]: Secondary | ICD-10-CM

## 2018-06-07 LAB — COMPREHENSIVE METABOLIC PANEL
ALBUMIN: 3.9 g/dL (ref 3.5–5.2)
ALK PHOS: 88 U/L (ref 39–117)
ALT: 449 U/L — AB (ref 0–53)
AST: 212 U/L — ABNORMAL HIGH (ref 0–37)
BILIRUBIN TOTAL: 0.5 mg/dL (ref 0.2–1.2)
BUN: 12 mg/dL (ref 6–23)
CO2: 31 meq/L (ref 19–32)
Calcium: 9.2 mg/dL (ref 8.4–10.5)
Chloride: 103 mEq/L (ref 96–112)
Creatinine, Ser: 0.86 mg/dL (ref 0.40–1.50)
GFR: 111.99 mL/min (ref >60.00)
Glucose, Bld: 90 mg/dL (ref 70–99)
Potassium: 4.1 mEq/L (ref 3.5–5.1)
Sodium: 139 mEq/L (ref 135–145)
Total Protein: 6.8 g/dL (ref 6.0–8.3)

## 2018-06-07 LAB — CBC
HCT: 41.1 % (ref 39.0–52.0)
HEMOGLOBIN: 14.3 g/dL (ref 13.0–17.0)
MCHC: 34.8 g/dL (ref 30.0–36.0)
MCV: 84.4 fl (ref 78.0–100.0)
PLATELETS: 217 10*3/uL (ref 150.0–400.0)
RBC: 4.87 Mil/uL (ref 4.22–5.81)
RDW: 13.5 % (ref 11.5–15.5)
WBC: 6.4 10*3/uL (ref 4.0–10.5)

## 2018-06-07 LAB — TSH: TSH: 2.03 u[IU]/mL (ref 0.35–4.50)

## 2018-06-07 NOTE — Progress Notes (Addendum)
Subjective:  Shawn Berger is a 27 y.o. year old very pleasant male patient who presents for/with See problem oriented charting ROS-Review of Systems  Constitutional: Negative for chills and fever.  HENT: Negative for hearing loss.   Eyes: Negative for blurred vision and double vision.  Respiratory: Negative for cough.   Cardiovascular: Negative for chest pain and palpitations.  Gastrointestinal: Negative for nausea.  Genitourinary: Negative for dysuria.  Psychiatric/Behavioral: Positive for depression. Negative for memory loss. The patient is nervous/anxious.    Past Medical History-  Patient Active Problem List   Diagnosis Date Noted  . Narcolepsy 06/07/2018    Priority: High  . Attention deficit hyperactivity disorder (ADHD) 07/04/2007    Priority: High  . Moderate single current episode of major depressive disorder (HCC) 04/28/2016    Priority: Medium  . GAD (generalized anxiety disorder) 04/28/2016    Priority: Medium  . Asperger syndrome 01/27/2011    Priority: Medium  . Anxiety 01/27/2011    Priority: Medium  . History of syphilis 11/03/2009    Priority: Low  . SYMPTOM, SYNCOPE AND COLLAPSE 07/04/2007    Priority: Low  . Pilonidal cyst with abscess 08/19/2016    Medications- reviewed and updated No current outpatient medications on file.   No current facility-administered medications for this visit.     Objective: BP 136/72   Pulse 93   Temp 97.9 F (36.6 C) (Oral)   Wt 163 lb 12.8 oz (74.3 kg)   SpO2 97%   BMI 25.09 kg/m  Gen: NAD, resting comfortably CV: RRR no murmurs rubs or gallops Lungs: CTAB no crackles, wheeze, rhonchi Abdomen: soft/nontender/nondistended/normal bowel sounds.   Ext: no edema Skin: warm, dry Depressed mood noted. No pressured speech.   Assessment/Plan:  Excessive sleepiness Depressed mood but also with concern for manic episodes (2 weeks of using methamphetamine and incredibly high sex drive)  S: Patient has dealt with  anxiety and depressed mood in the past. Starting within last month has felt incredibly down- has days where he feels ok but for the most part feels depressed. Feels like motivation is completely drained. Has seemed irritable lately. He is worried about possible bipolar. No thoughts of self harm at this time. Mother also concerned as patient has an Uncle with bipolar. He did not regularly take celexa last year.   He lost his job because he had a bad "narcolepsy episode"- a month ago. He was diagnosed with narcolepsy at age 81- fell asleep at work for 7 hours while on the toilet. He lost his job because of this. 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 He was told he needed documentation of narcolepsy likely for future employment. He does not drive but family is supportive and helps him drive. He is worried they are going to cut off their support if he continues to struggle.   When I asked about drug use he initially admits to some use of marijuana. 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 Does not want me to discuss with family. He admits he had a binge of basically 2 weeks using this or pursuing sexual relations with extremely high desire. He admits to unprotected sex in this time.   He states he feels he is 10 years behind his peers in everything. With his Asperger's he feels he has not built many meaningful relationships. He does have one friend who apparently used methamphetamine in past but has been clean for 3 years and wants to help  him. He initially found acceptance in drug use as he had people he could hang out with- but he has realized if he is not using- the same people don't want to hang out and he is disappointed. With his narcolepsy the alertness of the methamphetamine really helps him feel more normal. He asks about restarting ADD meds (I would not advise at this time due to recent drug use) A/P: This is a tough situation- 27 year old with Asperger's, recent  substance abuse, sexual promiscuity, narcolepsy, ADD, history of depressed mood. Mood disorder questionaire with 7 answers for yes and states several of these have happened in same period of time- states moderate problem. Also PHQ9 of 13 and GAD7 elevated at 17. Fortunately no SI  For narcolepsy- he needs updated sleep evaluation so will refer to Dr. Vickey Huger. I wonder if him his difficulties with narcolepsy worsen his mood. He asks about ADD meds but with methamphetamine use I advised against for now  With substance abuse and sexual promiscuity binge- I am concerned about possible Bipolar. Will refer to Dr. Rene Kocher. Patient traveling from [place] and unfortunately Im not sure of local resources for him there but I told him I had full trust in Dr. Rene Kocher- patient may need family support here as I noted at end of visit states self pay in insurance. May need to use option like [facility] or similar in [place] area. I also advised him to seek out NA meetings given substance use- he seems somewhat hesitant  With symptoms updated labs and noted high LFT elevation Lab Results  Component Value Date   ALT 449 (H) 06/07/2018   AST 212 (H) 06/07/2018   ALKPHOS 88 06/07/2018   BILITOT 0.5 06/07/2018  I wonder if methamphetamine use could cause this as no other meds and no liver history  Will update STD testing given unprotected sex. He asks about PREP at end of visit and we can consider this but need to further understand LFT elevation first.  Advised 1-2 week follow up   Lab/Order associations: Excessive sleepiness - Plan: Ambulatory referral to Neurology  Fatigue, unspecified type - Plan: CBC, Comprehensive metabolic panel, TSH  Depressed mood - Plan: Ambulatory referral to Psychiatry  Anxiety - Plan: Ambulatory referral to Psychiatry  Family history of bipolar disorder - Plan: Ambulatory referral to Psychiatry  Need for hepatitis B screening test - Plan: Hepatitis B surface antigen, Hepatitis B  surface antibody,qualitative, Hepatitis B core antibody, total  Encounter for screening for HIV - Plan: HIV antibody  Screening for STD (sexually transmitted disease) - Plan: RPR, Urine cytology ancillary only, Urine cytology ancillary only  Screening for gonorrhea - Plan: Urine cytology ancillary only  Screening for chlamydial disease - Plan: Urine cytology ancillary only  Screening examination for venereal disease - Plan: RPR  Time Stamp The duration of face-to-face time during this visit was greater than 40 minutes (1:45 PM to 2:46 PM). Greater than 50% of this time was spent in counseling, explanation of diagnosis, planning of further management, and/or coordination of care including counseling on his stressors, counseling on need to abstain from methamphetamine and risks to his health, discussing reasons for referrals vs treatment form our office, risks of unprotected sex.    Return precautions advised.  Tana Conch, MD

## 2018-06-07 NOTE — Patient Instructions (Addendum)
We will call you within two weeks about your referral to Dr. Vickey Huger of neurology for narcolepsy evaluation . If you do not hear within 3 weeks, give [place] a call.   Team- please get him a list of psychiatry options. I want you to try to get in ASAP for evaluation- due to concern for possible Bipolar. I think  health would be a good option.   If you have thoughts of self harm please contact us or call 911 immediately  Please stop by lab before you go  1-2 week check in to see how you are doing and where you are at in this process

## 2018-06-08 ENCOUNTER — Telehealth: Payer: Self-pay | Admitting: Family Medicine

## 2018-06-08 ENCOUNTER — Other Ambulatory Visit: Payer: Self-pay

## 2018-06-08 DIAGNOSIS — B20 Human immunodeficiency virus [HIV] disease: Secondary | ICD-10-CM

## 2018-06-08 LAB — HEPATITIS B SURFACE ANTIGEN: Hepatitis B Surface Ag: NONREACTIVE

## 2018-06-08 LAB — HIV-1/2 AB - DIFFERENTIATION
HIV-1 antibody: POSITIVE — AB
HIV-2 Ab: UNDETERMINED — AB

## 2018-06-08 LAB — HIV ANTIBODY (ROUTINE TESTING W REFLEX): HIV 1&2 Ab, 4th Generation: REACTIVE — AB

## 2018-06-08 LAB — HEPATITIS B SURFACE ANTIBODY,QUALITATIVE: Hep B S Ab: REACTIVE — AB

## 2018-06-08 LAB — URINE CYTOLOGY ANCILLARY ONLY
CHLAMYDIA, DNA PROBE: NEGATIVE
Neisseria Gonorrhea: NEGATIVE

## 2018-06-08 LAB — HEPATITIS B CORE ANTIBODY, TOTAL: Hep B Core Total Ab: NONREACTIVE

## 2018-06-08 LAB — RPR: RPR Ser Ql: NONREACTIVE

## 2018-06-08 NOTE — Telephone Encounter (Signed)
See note.  Copied from CRM [id number]. Topic: Inquiry >> Jun 08, 2018  4:44 PM Maia Petties wrote: Reason for CRM: pt asking if it's possible to get ID doctor in [facility] or [place] area. He is wanting a recommendation from Dr. Durene Cal.

## 2018-06-11 ENCOUNTER — Telehealth (HOSPITAL_COMMUNITY): Payer: Self-pay | Admitting: Psychiatry

## 2018-06-11 NOTE — Telephone Encounter (Signed)
D:  Lea @ [facility] called to refer pt for MH-IOP.  A:  Placed call to pt to orient him.  According to pt, he doesn't want groups.  "I don't want to talk about my personal issues in a group."  Pt is requesting a therapist in [place] area.  Encouraged pt to contact his insurance company re: approved providers.  Pt states he doesn't have any insurance.  Mentioned to pt that he could complete a patient assistance application for [facility] services or he could google therapists in his area.  Pt reports he already knows of some therapists there and can call them.  Informed Lea.  R:  Pt receptive.

## 2018-06-11 NOTE — Telephone Encounter (Signed)
I dont have a problem with that- does he know of a doctor in that area that can help him? Could he or his parents help research (if he is open to discussing with parents of course).

## 2018-06-14 NOTE — Telephone Encounter (Signed)
Spoke to pt, told him calling back in regards to ID doctor in [place] or [place] area. Told him Dr. Durene CalHunter does not have a problem with you seeing someone in that area but he does not know any providers. Do you know of any? Pt said he got it all worked out. He went to the [facility] yesterday in [place] and they are helping him get set up with a provider and appointments. Told pt okay I will let Dr. Durene CalHunter know and if you need anything from us please call. Pt verbalized understanding.

## 2019-02-13 ENCOUNTER — Encounter: Payer: Self-pay | Admitting: Family Medicine

## 2019-02-13 ENCOUNTER — Ambulatory Visit (INDEPENDENT_AMBULATORY_CARE_PROVIDER_SITE_OTHER): Payer: Self-pay

## 2019-02-13 ENCOUNTER — Telehealth: Payer: Self-pay

## 2019-02-13 ENCOUNTER — Ambulatory Visit (INDEPENDENT_AMBULATORY_CARE_PROVIDER_SITE_OTHER): Payer: Self-pay | Admitting: Family Medicine

## 2019-02-13 VITALS — Ht 67.75 in | Wt 177.0 lb

## 2019-02-13 DIAGNOSIS — R7612 Nonspecific reaction to cell mediated immunity measurement of gamma interferon antigen response without active tuberculosis: Secondary | ICD-10-CM

## 2019-02-13 DIAGNOSIS — Z21 Asymptomatic human immunodeficiency virus [HIV] infection status: Secondary | ICD-10-CM | POA: Insufficient documentation

## 2019-02-13 DIAGNOSIS — G47419 Narcolepsy without cataplexy: Secondary | ICD-10-CM

## 2019-02-13 NOTE — Patient Instructions (Signed)
There are no preventive care reminders to display for this patient.  Depression screen PHQ 2/9 06/07/2018  Decreased Interest 1  Down, Depressed, Hopeless 2  PHQ - 2 Score 3  Altered sleeping 3  Tired, decreased energy 3  Change in appetite 3  Trouble concentrating 1  Moving slowly or fidgety/restless 0  Suicidal thoughts 0  PHQ-9 Score 13  Difficult doing work/chores Extremely dIfficult

## 2019-02-13 NOTE — Telephone Encounter (Signed)
Reason for CRM: dad calling to ask a question concerning pt.  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  Pt is staying with his parents, and they are concerned if they should be concerned to be around the pt. They are wanting to ask if pt could get a chest xray to confirm this is positive. They have been waiting to hear back from the health dept 3 days, but still have not. Mom is on the DPR and advised dad we could speak with her about the pt, and he did confirm pt could do a virtual visit if Dr Durene Cal can. Please call back thanks  Mr wride [phone]    I called pt and placed him on the schedule for today.

## 2019-02-13 NOTE — Progress Notes (Signed)
Phone [phone]   Subjective:  Virtual visit via Video note. Chief complaint: Chief Complaint  Patient presents with  . TB concerns    This visit type was conducted due to national recommendations for restrictions regarding the COVID-19 Pandemic (e.g. social distancing).  This format is felt to be most appropriate for this patient at this time balancing risks to patient and risks to population by having him in for in person visit.  No physical exam was performed (except for noted visual exam or audio findings with Telehealth visits).    Our team/I connected with Sherlie BanNathan G Bashore on 02/13/19 at 10:40 AM EDT by a video enabled telemedicine application ([email or web address]) and verified that I am speaking with the correct person using two identifiers.  Location patient: Home-[facility] Location provider: [facility], office Persons participating in the virtual visit:  patient  Our team/I discussed the limitations of evaluation and management by telemedicine and the availability of in person appointments. In light of current covid-19 pandemic, patient also understands that we are trying to protect them by minimizing in office contact if at all possible.  The patient expressed consent for telemedicine visit and agreed to proceed. Patient understands insurance will be billed.   ROS- no fever, chills, cough, new fatigue, hemoptysis   Past Medical History-  Patient Active Problem List   Diagnosis Date Noted  . Narcolepsy 06/07/2018    Priority: High  . Attention deficit hyperactivity disorder (ADHD) 07/04/2007    Priority: High  . Moderate single current episode of major depressive disorder (HCC) 04/28/2016    Priority: Medium  . GAD (generalized anxiety disorder) 04/28/2016    Priority: Medium  . Asperger syndrome 01/27/2011    Priority: Medium  . Anxiety 01/27/2011    Priority: Medium  . History of syphilis 11/03/2009    Priority: Low  . SYMPTOM, SYNCOPE AND COLLAPSE 07/04/2007    Priority: Low  .  Pilonidal cyst with abscess 08/19/2016    Medications- reviewed and updated Current Outpatient Medications  Medication Sig Dispense Refill  . bictegravir-emtricitabine-tenofovir AF (BIKTARVY) 50-200-25 MG TABS tablet Take 1 tablet by mouth daily.     No current facility-administered medications for this visit.      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  Was not able to get sleep study (excessive sleepiness)  or evaluation from psychiatry (promiscuous sexual behavior and concern for bipoloar or other disorder)  Diangosed with HIV here in September but had moved to [place]- tried to get him into ID but he did not have insurance. Apparently through help of [facility]- has been able to get care through a family physician who manages [place] Eugene Ryenolds, MD. For his HIV - he ison biktarvy. He reports undetectable HIV levels back in January.compliant with medication.    He was in jail for a month and had a TB test- and was told everything was fine. Had physical and bloodwork done last week and was told he had tuberculosis potentially due to positive  quantiferon  gold test. May  be going into rehab program in [place] within the next few weeks.   No cough or blood in sputum. Wonders about getting x-ray. Tb skin test in march then had quanterferon gold test. Has not heard back from them.  A/P: Patient with positive quantiferon gold test- he is HIV positive. He is asymptomtaic but with HIV could still have active TB without symptoms. Recommend he follow up  with [place] or [place] health department- particularly with him being uninsured due to cost of x-ray. He is with his mother who really wants him to have x-ray today if at all possible (they have been with him for a week and have been exposed- recommend they wear masks and seek care with their physician- discussed active TB obviously would be the risk here). Mother is going to help him cover x-ray cost and he will come out for visit today- will come through [facility] and Violeta Gelinas will escort him to x-ray (she will wear n95) - advised contact with local HD once again ([place] potentially with [place] 1.5 hours away). He is also considering getting set up with [facility] through [facility]- I pressed again on health department.  - they want to do x-ray today as they have had a hard time getting in contact with clinic that told him about positive quantiferon Gold.   - for excessive sleepiness was diagnosed with narcolepsy as child- he is  Trying to get medicaid and can refer back to Dr. Vickey Huger or someone in [place] once things stabilized- first job loss that started all this started with an episode of falling asleep on the toilet for 7 hours at work.  -For HIV-glad he has had close follow-up in [place]-will need to establish with a provider in [facility]  -for substance abuse- glad he is trying to get into rehab program but tuberculosis treatment whether active or latent may delay this  Lab/Order associations: Positive QuantiFERON-TB Gold test - Plan: DG Chest 2 View  Asymptomatic HIV infection (HCC)  Primary narcolepsy without cataplexy  Time Stamp The duration of face-to-face time during this visit was greater than 25 minutes. Greater than 50% of this time was spent in counseling, explanation of diagnosis, planning of further management, and/or coordination of care including discussing follow up options, discussing cost concerns, discussing potential delay  in rehab, discussing how HIV complicates TB diagnosis and treatment, counseling family members to see their PCP.    Return precautions advised.  Tana Conch, MD

## 2019-02-14 ENCOUNTER — Telehealth: Payer: Self-pay | Admitting: Family Medicine

## 2019-02-14 NOTE — Telephone Encounter (Signed)
Copied from CRM [id number]. Topic: General - Other >> Feb 14, 2019 12:49 PM Marylen Ponto wrote: Reason for CRM: Pt mother Darl Pikes called in stating pt had x-ray done yesterday and she would like the x-ray results. Pt mother requests call back. Cb# [phone]

## 2019-02-14 NOTE — Telephone Encounter (Signed)
X-ray has not been read- typically would be read by now- Violeta Gelinas if not read by tomorrow- can yo umake sure its int he que for radiology to read?

## 2019-02-14 NOTE — Telephone Encounter (Signed)
Please advise 

## 2019-02-15 NOTE — Telephone Encounter (Signed)
See note, mother called again with request

## 2019-02-15 NOTE — Telephone Encounter (Signed)
Pt mother notified of update. Will inform her when results are in.

## 2019-02-15 NOTE — Telephone Encounter (Signed)
See result note from today 

## 2019-02-15 NOTE — Telephone Encounter (Signed)
Called pt and mother and gave results.  The heart size and mediastinal contours are within normal limits. Both lungs are clear. The visualized skeletal structures are unremarkable.  IMPRESSION: No active cardiopulmonary disease.  Also, pt wanted to know if he should take any medication.  Please advise

## 2019-02-15 NOTE — Telephone Encounter (Signed)
Pt mother called in very concerned for pt and family members. Pt may have TB and his father has asthma and high risk. Please call today if possible with results.   Aldean Baker [phone]

## 2019-05-29 ENCOUNTER — Ambulatory Visit (INDEPENDENT_AMBULATORY_CARE_PROVIDER_SITE_OTHER): Payer: Self-pay | Admitting: Pulmonary Disease

## 2019-05-29 ENCOUNTER — Encounter: Payer: Self-pay | Admitting: Pulmonary Disease

## 2019-05-29 ENCOUNTER — Institutional Professional Consult (permissible substitution): Payer: Self-pay | Admitting: Pulmonary Disease

## 2019-05-29 ENCOUNTER — Other Ambulatory Visit: Payer: Self-pay

## 2019-05-29 VITALS — BP 102/64 | HR 79 | Temp 98.0°F | Ht 66.5 in | Wt 163.8 lb

## 2019-05-29 DIAGNOSIS — G4710 Hypersomnia, unspecified: Secondary | ICD-10-CM

## 2019-05-29 DIAGNOSIS — G47419 Narcolepsy without cataplexy: Secondary | ICD-10-CM

## 2019-05-29 NOTE — Progress Notes (Signed)
Subjective:    Patient ID: Shawn Berger, male    DOB: 1990/12/10, 28 y.o.   MRN: [id number]  HPI  Chief Complaint  Patient presents with  . Consult    narcolespsy    28 year old man presents for evaluation of excessive daytime somnolence. He reports sleepiness while having conversations or watching TV.  He reports frequent naps in the daytime.  He does not drive voluntarily.  He reports onset of symptoms in childhood and remembers that he was given a diagnosis of narcolepsy at age 28 after he had sleep studies, 1 of what sounds like an M SLT with frequent naps.  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  He was diagnosed with HIV in 06/2018 and he is on HAART, follows up at [facility].  He was recently diagnosed with rectal chlamydia in 03/2019.  Epworth sleepiness score is 19/24 Bedtime is anywhere between 10 PM and midnight, TV stays on through the night, he sleeps for about 5 hours before spontaneous awakenings, denies leg jerks or snoring.  He then stays awake for a few hours before napping again involuntarily.  He does not have any schedule during the day so just takes naps as needed most of his naps are in bed and can last from anywhere between 1 to 5 hours. He denies excessive caffeinated beverages although he drinks soda on a regular basis.  He can drink a cup of coffee and then go right to sleep.  He drinks alcohol socially but denies excessive use or has never for the need to cut down. He smokes 1/2 pack/day.  There is no history suggestive of cataplexy, sleep paralysis or parasomnias   He also variously describes symptoms of anxiety, depression and attention deficit disorder although he denies ever having a psychiatric evaluation, his problem list shows a diagnosis of Asperger's syndrome.  He is not currently on medications for these   Past Medical History:  Diagnosis Date  . ADHD (attention deficit hyperactivity disorder)    . Asperger syndrome 2012  . Syncope 2008   was eval at the ER    Past Surgical History:  Procedure Laterality Date  . none      No Known Allergies  Social History   Socioeconomic History  . Marital status: Single    Spouse name: Not on file  . Number of children: Not on file  . Years of education: Not on file  . Highest education level: Not on file  Occupational History  . Not on file  Social Needs  . Financial resource strain: Not on file  . Food insecurity    Worry: Not on file    Inability: Not on file  . Transportation needs    Medical: Not on file    Non-medical: Not on file  Tobacco Use  . Smoking status: Current Every Day Smoker    Packs/day: 0.50    Years: 16.00    Pack years: 8.00    Types: Cigarettes    Start date: 2004  . Smokeless tobacco: Never Used  Substance and Sexual Activity  . Alcohol use: Yes    Alcohol/week: 1.0 standard drinks    Types: 1 Shots of liquor per week  . Drug use: Yes    Types: Marijuana  . Sexual activity: Yes    Birth control/protection: Condom  Lifestyle  . Physical activity    Days per week: Not on file  Minutes per session: Not on file  . Stress: Not on file  Relationships  . Social Musicianconnections    Talks on phone: Not on file    Gets together: Not on file    Attends religious service: Not on file    Active member of club or organization: Not on file    Attends meetings of clubs or organizations: Not on file    Relationship status: Not on file  . Intimate partner violence    Fear of current or ex partner: Not on file    Emotionally abused: Not on file    Physically abused: Not on file    Forced sexual activity: Not on file  Other Topics Concern  . Not on file  Social History Narrative   Dating. Lives w/ his parents.       Finished HS, no college education.    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 On feet 10-11 hours.       Hobbies: enjoys time outside,  walking around woods for example     Family History  Problem Relation Age of Onset  . Healthy Mother   . Healthy Father   . Healthy Sister   . Vitiligo Brother   . Healthy Sister      Review of Systems  Constitutional: Negative for fever and unexpected weight change.  HENT: Negative for congestion, dental problem, ear pain, nosebleeds, postnasal drip, rhinorrhea, sinus pressure, sneezing, sore throat and trouble swallowing.   Eyes: Negative for redness and itching.  Respiratory: Negative for cough, chest tightness, shortness of breath and wheezing.   Cardiovascular: Negative for palpitations and leg swelling.  Gastrointestinal: Negative for nausea and vomiting.  Genitourinary: Negative for dysuria.  Musculoskeletal: Negative for joint swelling.  Skin: Negative for rash.  Allergic/Immunologic: Negative.  Negative for environmental allergies, food allergies and immunocompromised state.  Neurological: Negative for headaches.  Hematological: Does not bruise/bleed easily.  Psychiatric/Behavioral: Positive for dysphoric mood. The patient is nervous/anxious.        Objective:   Physical Exam  Gen. Pleasant, well-nourished, in no distress, normal affect ENT - no pallor,icterus, no post nasal drip Neck: No JVD, no thyromegaly, no carotid bruits Lungs: no use of accessory muscles, no dullness to percussion, clear without rales or rhonchi  Cardiovascular: Rhythm regular, heart sounds  normal, no murmurs or gallops, no peripheral edema Abdomen: soft and non-tender, no hepatosplenomegaly, BS normal. Musculoskeletal: No deformities, no cyanosis or clubbing Neuro:  alert, non focal       Assessment & Plan:

## 2019-05-29 NOTE — Assessment & Plan Note (Signed)
It is possible based on his history that he has narcolepsy as a cause for his excessive daytime somnolence.  He does not seem to have any schedule currently.  He does not have any history to suggest cataplexy  We will set up sleep studies as an overnight polysomnogram followed by M SLT/daytime nap test -to test for narcolepsy Based on this we will discuss treatment options.  You do need a structure around your sleep and wake times Please set up schedule bedtime and wake up time with an alarm example 11 PM and 7 AM  -Try to arrange at least 2-3 naps during the day for 1 to 2 hours, you may have to set an alarm to wake up - light exercise x 30 minutes daily -Light exposure for 30 minutes -avoid caffeinated beverages

## 2019-05-29 NOTE — Patient Instructions (Signed)
We will set up sleep studies as an overnight polysomnogram followed by M SLT/daytime nap test -to test for narcolepsy Based on this we will discuss treatment options.  You do need a structure around your sleep and wake times Please set up schedule bedtime and wake up time with an alarm example 11 PM and 7 AM  -Try to arrange at least 2-3 naps during the day for 1 to 2 hours, you may have to set an alarm to wake up - light exercise x 30 minutes daily -Light exposure for 30 minutes -avoid caffeinated beverages

## 2019-06-17 ENCOUNTER — Ambulatory Visit (HOSPITAL_BASED_OUTPATIENT_CLINIC_OR_DEPARTMENT_OTHER): Payer: Medicaid Other | Attending: Pulmonary Disease | Admitting: Pulmonary Disease

## 2019-06-17 ENCOUNTER — Other Ambulatory Visit: Payer: Self-pay

## 2019-06-17 DIAGNOSIS — R400 Somnolence: Secondary | ICD-10-CM | POA: Insufficient documentation

## 2019-06-17 DIAGNOSIS — G4710 Hypersomnia, unspecified: Secondary | ICD-10-CM | POA: Insufficient documentation

## 2019-06-17 DIAGNOSIS — R0683 Snoring: Secondary | ICD-10-CM | POA: Insufficient documentation

## 2019-06-18 ENCOUNTER — Ambulatory Visit (HOSPITAL_BASED_OUTPATIENT_CLINIC_OR_DEPARTMENT_OTHER): Payer: Medicaid Other | Attending: Pulmonary Disease | Admitting: Pulmonary Disease

## 2019-06-18 DIAGNOSIS — G4710 Hypersomnia, unspecified: Secondary | ICD-10-CM | POA: Insufficient documentation

## 2019-06-18 NOTE — Procedures (Signed)
Patient Name: Shawn Berger, Shawn Berger Date: 06/17/2019 Gender: Male D.O.B: 1991/06/25 Age (years): 27 Referring Provider: Kara Mead MD, ABSM Height (inches): 67 Interpreting Physician: Kara Mead MD, ABSM Weight (lbs): 170 RPSGT: Zadie Rhine BMI: 27 MRN: [id number] Neck Size: 16.50 <br> <br> CLINICAL INFORMATION Sleep Study Type: NPSG  Indication for sleep study: excessive Daytime somnolence    Epworth Sleepiness Score: 14    SLEEP STUDY TECHNIQUE As per the AASM Manual for the Scoring of Sleep and Associated Events v2.3 (April 2016) with a hypopnea requiring 4% desaturations.  The channels recorded and monitored were frontal, central and occipital EEG, electrooculogram (EOG), submentalis EMG (chin), nasal and oral airflow, thoracic and abdominal wall motion, anterior tibialis EMG, snore microphone, electrocardiogram, and pulse oximetry.  MEDICATIONS Medications self-administered by patient taken the night of the study : N/A  SLEEP ARCHITECTURE The study was initiated at 10:42:24 PM and ended at 5:55:19 AM.  Sleep onset time was 5.8 minutes and the sleep efficiency was 89.4%%. The total sleep time was 387 minutes.  Stage REM latency was 57.0 minutes.  The patient spent 2.1%% of the night in stage N1 sleep, 32.4%% in stage N2 sleep, 16.8%% in stage N3 and 48.7% in REM.  Alpha intrusion was absent.  Supine sleep was 100.00%.  RESPIRATORY PARAMETERS The overall apnea/hypopnea index (AHI) was 1.4 per hour. There were 4 total apneas, including 3 obstructive, 1 central and 0 mixed apneas. There were 5 hypopneas and 17 RERAs.  The AHI during Stage REM sleep was 2.9 per hour.  AHI while supine was 1.4 per hour.  The mean oxygen saturation was 98.0%. The minimum SpO2 during sleep was 91.0%.  soft snoring was noted during this study.  CARDIAC DATA The 2 lead EKG demonstrated sinus rhythm. The mean heart rate was 74.4 beats per minute. Other EKG findings include:  None.  LEG MOVEMENT DATA The total PLMS were 0 with a resulting PLMS index of 0.0. Associated arousal with leg movement index was 0.0 .  IMPRESSIONS - No significant obstructive sleep apnea occurred during this study (AHI = 1.4/h). - No significant central sleep apnea occurred during this study (CAI = 0.2/h). - The patient had minimal or no oxygen desaturation during the study (Min O2 = 91.0%) - The patient snored with soft snoring volume. - No cardiac abnormalities were noted during this study. - Clinically significant periodic limb movements did not occur during sleep. No significant associated arousals.   DIAGNOSIS - Hypersomnolence   RECOMMENDATIONS - Proceed with MSLT Avoid alcohol, sedatives and other CNS depressants that may worsen sleep apnea and disrupt normal sleep architecture. - Sleep hygiene should be reviewed to assess factors that may improve sleep quality. - Weight management and regular exercise should be initiated or continued if appropriate.  Kara Mead MD Board Certified in [place]

## 2019-06-19 ENCOUNTER — Other Ambulatory Visit: Payer: Self-pay

## 2019-06-25 DIAGNOSIS — G47419 Narcolepsy without cataplexy: Secondary | ICD-10-CM

## 2019-06-25 NOTE — Procedures (Signed)
Patient Name: Shawn Berger, Shawn Berger Date: 06/18/2019 Gender: Male D.O.B: 12/28/1990 Age (years): 27 Referring Provider: Kara Mead MD, ABSM Height (inches): 67 Interpreting Physician: Kara Mead MD, ABSM Weight (lbs): 170 RPSGT: Jacolyn Reedy BMI: 27 MRN: [id number] Neck Size: 16.50 <br> <br> CLINICAL INFORMATION Sleep Study Type: MSLT    The patient was referred to the sleep center for evaluation of daytime sleepiness. MSLT was preceded by NPSG which did not show any evidence of sleep disorder.    Epworth Sleepiness Score: 14  SLEEP STUDY TECHNIQUE A Multiple Sleep Latency Test was performed after an overnight polysomnogram according to the AASM scoring manual v2.3 (April 2016) and clinical guidelines. Five nap opportunities occurred over the course of the test which followed an overnight polysomnogram. The channels recorded and monitored were frontal, central, and occipital electroencephalography (EEG), right and left electrooculogram (EOG), chin electromyography (EMG), and electrocardiogram (EKG).  MEDICATIONS Medications taken by the patient : N/A Medications administered by patient during sleep study : No sleep medicine administered.   IMPRESSIONS - Total number of naps attempted: 5 . Total number of naps with sleep attained: 5 . The Mean Sleep Latency was 00:45 minutes. There were 5 sleep-onset REM periods. - The patient appears to have pathologic sleepiness, evidenced by a short mean sleep latency (8 minutes or less) on this MSLT. - No sleep onset REMs were noted during this MSLT.   DIAGNOSIS -  hypersomnolence - Presence of 5 SOREMs would be consistent with narcolepsy   RECOMMENDATIONS - Return for follow up and management of narcolepsy & rule out other causes of excessive daytime sleepiness.   Kara Mead MD Board Certified in Sleep medicine   06/25/2019

## 2019-07-02 ENCOUNTER — Telehealth: Payer: Self-pay

## 2019-07-02 NOTE — Telephone Encounter (Signed)
-----   Message from Rigoberto Noel, MD sent at 06/25/2019  1:28 PM EDT ----- Pl arrange FU with me to discuss sleep study results  RA

## 2019-07-02 NOTE — Telephone Encounter (Signed)
Left message for patient to call back  

## 2019-07-12 NOTE — Telephone Encounter (Signed)
Left message for patient to call back.   Will close this encounter.

## 2019-08-12 ENCOUNTER — Encounter: Payer: Self-pay | Admitting: Pulmonary Disease

## 2019-08-12 ENCOUNTER — Ambulatory Visit (INDEPENDENT_AMBULATORY_CARE_PROVIDER_SITE_OTHER): Payer: Self-pay | Admitting: Pulmonary Disease

## 2019-08-12 ENCOUNTER — Other Ambulatory Visit: Payer: Self-pay

## 2019-08-12 DIAGNOSIS — G4726 Circadian rhythm sleep disorder, shift work type: Secondary | ICD-10-CM

## 2019-08-12 DIAGNOSIS — G47419 Narcolepsy without cataplexy: Secondary | ICD-10-CM

## 2019-08-12 MED ORDER — AMPHETAMINE-DEXTROAMPHET ER 20 MG PO CP24: 20.0000 mg | ORAL_CAPSULE | Freq: Every day | ORAL | 0 refills | Status: DC

## 2019-08-12 NOTE — Progress Notes (Signed)
   Subjective:    Patient ID: Shawn Berger, male    DOB: 1991/03/04, 28 y.o.   MRN: [id number]  HPI  28 year old for follow-up of excessive somnolence  PMH -  onset of symptoms in childhood and remembers that he was given a diagnosis of narcolepsy at age 44 after he had sleep studies ? M SLT  He was diagnosed with HIV in 06/2018 and he is on HAART, follows up at [facility].  anxiety, depression and attention deficit disorder , latent TB  Chief Complaint  Patient presents with  . Follow-up    Patient here to discuss sleep study results.    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  He continues to report excessive sleepiness in the daytime.  He has done well on Biktarvy and his counts are good  He reports ADHD is a child and took Adderall from third grade until he was in high school and this really seem to help him  We reviewed and discussed his sleep studies including MSLT   Significant tests/ events reviewed  NPSG 06/17/19  TST 387 mins MSLT 06/18/19 >> latency < 63min, 5/5 SOREMs    Review of Systems neg for any significant sore throat, dysphagia, itching, sneezing, nasal congestion or excess/ purulent secretions, fever, chills, sweats, unintended wt loss, pleuritic or exertional cp, hempoptysis, orthopnea pnd or change in chronic leg swelling. Also denies presyncope, palpitations, heartburn, abdominal pain, nausea, vomiting, diarrhea or change in bowel or urinary habits, dysuria,hematuria, rash, arthralgias, visual complaints, headache, numbness weakness or ataxia.     Objective:   Physical Exam   Gen. Pleasant, well-nourished, in no distress, normal affect ENT - no pallor,icterus, no post nasal drip Neck: No JVD, no thyromegaly, no carotid bruits Lungs: no use of accessory muscles, no dullness to percussion, clear without rales or rhonchi  Cardiovascular: Rhythm  regular, heart sounds  normal, no murmurs or gallops, no peripheral edema Abdomen: soft and non-tender, no hepatosplenomegaly, BS normal. Musculoskeletal: No deformities, no cyanosis or clubbing Neuro:  alert, non focal        Assessment & Plan:

## 2019-08-12 NOTE — Assessment & Plan Note (Signed)
His shiftwork is also likely to complicate his daytime somnolence. Emphasized need for structured sleep schedule

## 2019-08-12 NOTE — Assessment & Plan Note (Addendum)
Emphasized the role of sleep schedule and daytime naps as important times medication  Please set up schedule bedtime and wake up time with an alarm example 11 PM and 7 AM  -Try to arrange at least 2 naps during the day for 1 to 2 hours, you may have to set an alarm to wake up - light exercise x 30 minutes daily -Light exposure for 30 minutes -avoid caffeinated beverages   Rx for adderall sent to pharmacy - take this at 9pm on your workdays  On days off can take this around 8 -9  Am Checked that this has no drug interactions with Biktarvy .  On his follow-up visit in 1 month, we can increase dose if required and given refills

## 2019-08-12 NOTE — Patient Instructions (Addendum)
Please set up schedule bedtime and wake up time with an alarm example 11 PM and 7 AM  -Try to arrange at least 2 naps during the day for 1 to 2 hours, you may have to set an alarm to wake up - light exercise x 30 minutes daily -Light exposure for 30 minutes -avoid caffeinated beverages   Rx for adderall sent to pharmacy - take this at 9pm on your workdays  On days off can take this around 8 -9  am

## 2020-04-13 ENCOUNTER — Telehealth: Payer: Self-pay

## 2020-04-13 NOTE — Telephone Encounter (Signed)
Pt's mother called stating patient has not been taking any psych meds for quite awhile. He is seeing things and hearing people, but not a danger to himself or others. He is not to keen on seeing a provider for his mental health because he claims he is okay. Mother states in November he was diagnoses with schizophrenia. She said he liked Dr. Durene Cal greatly last time and hopes he may be open to seeing him. She would like to tell her son its for the acne he has been wanting help for and hope that Dr. Durene Cal picks up on his mental health. The only opening Dr. Durene Cal has for anytime near is this Friday. Do I have permission to schedule him for that? It is listed as a sameday.

## 2020-04-13 NOTE — Telephone Encounter (Signed)
Please advise 

## 2020-04-13 NOTE — Telephone Encounter (Signed)
I do not manage schizophrenia and since he is having hallucinations.  We need to get him in contact with [facility]  I am fine using a same-day slot and I can reinforce this plan.  I do think we need to be honest with him why he is coming into the office though.

## 2020-04-15 ENCOUNTER — Other Ambulatory Visit: Payer: Self-pay

## 2020-04-15 ENCOUNTER — Encounter: Payer: Self-pay | Admitting: Family Medicine

## 2020-04-15 ENCOUNTER — Ambulatory Visit (INDEPENDENT_AMBULATORY_CARE_PROVIDER_SITE_OTHER): Payer: Medicaid Other | Admitting: Family Medicine

## 2020-04-15 VITALS — BP 130/76 | HR 100 | Temp 97.6°F | Ht 67.0 in | Wt 178.0 lb

## 2020-04-15 DIAGNOSIS — G47419 Narcolepsy without cataplexy: Secondary | ICD-10-CM

## 2020-04-15 DIAGNOSIS — Z21 Asymptomatic human immunodeficiency virus [HIV] infection status: Secondary | ICD-10-CM

## 2020-04-15 DIAGNOSIS — F419 Anxiety disorder, unspecified: Secondary | ICD-10-CM

## 2020-04-15 DIAGNOSIS — F900 Attention-deficit hyperactivity disorder, predominantly inattentive type: Secondary | ICD-10-CM

## 2020-04-15 DIAGNOSIS — F15959 Other stimulant use, unspecified with stimulant-induced psychotic disorder, unspecified: Secondary | ICD-10-CM

## 2020-04-15 NOTE — Patient Instructions (Addendum)
Health Maintenance Due  Topic Date Due   Hepatitis C Screening-consider with future blood work but you may have had this with infectious disease Never done   COVID-19 Vaccine (1) declined at this time  Never done   We really need to get you off methamphetamine permanently for your long-term health.  It is possible the burning sensation in your legs could be neuropathy related to methamphetamine.  With the voices your hearing and hallucinations and delusions -I really think you would be best served by psychiatry.  Please follow-up with [facility].  We also gave you a list of other potential options.  Neuropsychiatrist require self-referral at least to initiate but I am happy to send in a referral once you get plugged in.  I also think once you are in a better position from paranoia and delusions-we need you to get you back into Dr. Vassie Loll or consider a referral to a different sleep specialist Dr. Vickey Huger  Given you are methamphetamine use history-I do not think primary care/myself should prescribe Adderall-we would need to do that under specialty care with psychiatry  Recommended follow up: Return in about 1-2 month (around 05/16/2020).  I would like to follow-up in 1 to 2 months to see how you are doing

## 2020-04-15 NOTE — Assessment & Plan Note (Signed)
#   Anxiety.  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 #Narcolepsy/ADD -with mental health issues I believe patient would be best served by management by psychiatry or sleep specialist S: Recently, reports infectious disease was concerned about mental health and substance abuse history-encourage follow-up with [facility]- saw a counselor just once.  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  He states he did not feel better while hospitalized and has very similar symptoms to that time  He states he has had an underlying paranoia and bad feelings of dejavu. Feels like things are repearing in his life. Feels like started in [place] in 2019. He will hear his name called and it is not being called. Also will get a burning sensation in his feet and states they are almost raw- not present today.   He is using meth about twice a month.  A/P: 29 year old male with psychosis.  Denies suicidal thoughts.  Needs follow-up with psychiatry as below.  Patient wanted me to restart Adderall but with substance abuse history I did not think this was a good idea-I do think he is using it to try to help with sensation of ADD and narcolepsy and he did feel better when he was on Adderall but I think that simply has to be prescribed by psychiatry.  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  Last year he was trying to Medicaid so we could refer to Dr. Vickey Huger or another sleep specialist in [facility].  He ultimately got set up with Dr. Vassie Loll of pulmonary.  He was placed on Adderall as above.  Once again I think we have to work on paranoia and delusions and then get him plugged back in  with a specialist  From [facility] "  We really need to get you off methamphetamine permanently for your long-term health.  It is possible the burning sensation in your legs could be neuropathy related to methamphetamine.  With the voices your hearing and hallucinations and delusions -I really think you would be best served by psychiatry.  Please follow-up with [facility].  We also gave you a list of other potential options.  Neuropsychiatrist require self-referral at least to initiate but I am happy to send in a referral once you get plugged in.  I also think once you are in a better position from paranoia and delusions-we need you to get you back into Dr. Vassie Loll or consider a referral to a different sleep specialist Dr. Vickey Huger  Given you are methamphetamine use history-I do not think primary care/myself should prescribe Adderall-we would need to do that under specialty care with psychiatry  Recommended follow up: Return in about 1-2 month (around 05/16/2020).  I would like to follow-up in 1 to 2 months to see how you are doing  "

## 2020-04-15 NOTE — Progress Notes (Signed)
Phone [phone] In person visit   Subjective:   Shawn Berger is a 29 y.o. year old very pleasant male patient who presents for/with See problem oriented charting Chief Complaint  Patient presents with  . Anxiety   This visit occurred during the SARS-CoV-2 public health emergency.  Safety protocols were in place, including screening questions prior to the visit, additional usage of staff PPE, and extensive cleaning of exam room while observing appropriate contact time as indicated for disinfecting solutions.   Past Medical History-  Patient Active Problem List   Diagnosis Date Noted  . 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 Narcolepsy 06/07/2018    Priority: High  . Attention deficit hyperactivity disorder (ADHD) 07/04/2007    Priority: High  . Moderate single current episode of major depressive disorder (HCC) 04/28/2016    Priority: Medium  . GAD (generalized anxiety disorder) 04/28/2016    Priority: Medium  . Asperger syndrome 01/27/2011    Priority: Medium  . Anxiety 01/27/2011    Priority: Medium  . History of syphilis 11/03/2009    Priority: Low  . SYMPTOM, SYNCOPE AND COLLAPSE 07/04/2007    Priority: Low  . 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 Asymptomatic HIV infection (HCC) 02/13/2019  . Pilonidal cyst with abscess 08/19/2016    Medications- reviewed and updated Current Outpatient Medications  Medication Sig Dispense Refill  . amphetamine-dextroamphetamine (ADDERALL XR) 20 MG 24 hr capsule Take 1 capsule (20 mg total) by mouth daily. 30 capsule 0  . bictegravir-emtricitabine-tenofovir AF (BIKTARVY) 50-200-25 MG TABS tablet Take 1 tablet by mouth daily.     No current facility-administered medications for this visit.     Objective:  BP 130/76   Pulse 100   Temp 97.6 F (36.4 C) (Temporal)   Ht 5\' 7"  (1.702 m)   Wt 178 lb (80.7 kg)   SpO2 95%   BMI 27.88 kg/m  Gen: NAD, resting  comfortably     Assessment and Plan   # Anxiety.  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 #Narcolepsy/ADD -with mental health issues I believe patient would be best served by management by psychiatry or sleep specialist S: Recently, reports infectious disease was concerned about mental health and substance abuse history-encourage follow-up with [facility]- saw a counselor just once.  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  He states he did not feel better while hospitalized and has very similar symptoms to that time  He states he has had an underlying paranoia and bad feelings of dejavu. Feels like things are repearing in his life. Feels like started in December 2020 in 2019. He will hear his name called and it is not being called. Also will get a burning sensation in his feet and states they are almost raw- not present today.   He is using meth about twice a month. On phq9 reported 2 for question 9 but on my specific questioning he states no thoughts of self harm- has had thoughts fleeting of being better off dead  A/P: 29 year old male with psychosis as well as depressed mood and anxiety.  Denies suicidal thoughts.  Needs follow-up with psychiatry as below (I am concerned about risks of primary care starting meds without more comprehensive plan for paranoia and delusions portion) .  Patient  wanted me to restart Adderall but with substance abuse history I did not think this was a good idea-I do think he is using it to try to help with sensation of ADD and narcolepsy and he did feel better when he was on Adderall but I think that simply has to be prescribed by psychiatry.  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  Last year he was trying to Medicaid so we could refer to Dr. Vickey Huger or another sleep specialist in [facility].  He ultimately got set up with Dr. Vassie Loll of pulmonary.  He was placed on Adderall as above.  Once again I think we have to work on paranoia and delusions and then get him plugged back in with a specialist  From [facility] "  We really need to get you off methamphetamine permanently for your long-term health.  It is possible the burning sensation in your legs could be neuropathy related to methamphetamine.  With the voices your hearing and hallucinations and delusions -I really think you would be best served by psychiatry.  Please follow-up with [facility].  We also gave you a list of other potential options.  Neuropsychiatrist require self-referral at least to initiate but I am happy to send in a referral once you get plugged in.  I also think once you are in a better position from paranoia and delusions-we need you to get you back into Dr. Vassie Loll or consider a referral to a different sleep specialist Dr. Vickey Huger  Given you are methamphetamine use history-I do not think primary care/myself should prescribe Adderall-we would need to do that under specialty care with psychiatry  Recommended follow up: Return in about 1-2 month (around 05/16/2020).  I would like to follow-up in 1 to 2 months to see how you are doing  "   #HIV-followed closely with last visit April 04, 2019 with infectious disease at [facility].  He is compliant with antiretroviral therapy   #Latent tuberculosis and HIV positive patient S: Patient with positive quantiferon gold last year reassuring chest x-ray concerning for latent TB.  Due to potential cost will refer to his local health department-[place] or [place].  Per notes from infectious disease completed majority of treatment and per notes plan was to not restart A/P: Patient was appropriately treated per notes-no  reported recurrence of symptoms   #Acne vulgaris S: Referred to dermatology at July 2 visit with dermatology.  Minocycline has not been effective in the past.  Patient was excluded from an acne study because that study was only for facial acne and this was disappointing for him. A/P: Continued issues with acne.  Patient would like to wait on dermatology referral-I think that is reasonable  Recommended follow up: Return in about 1 month (around 05/16/2020). Or 2 months  Lab/Order associations:   ICD-10-CM   1. Attention deficit hyperactivity disorder (ADHD), predominantly inattentive type  F90.0   2. Anxiety  F41.9   3. Methamphetamine-induced psychotic disorder (HCC)  F15.959   4. Primary narcolepsy without cataplexy  G47.419   5. Asymptomatic HIV infection (HCC)  Z21    Time Spent: 32 minutes of total time (8:08 AM- 8:42 AM) was spent on the date of the encounter performing the following actions: chart review prior to seeing the patient, obtaining history, performing a medically necessary exam, counseling on the treatment plan, placing orders, and documenting in our EHR.  Return precautions advised.  Garret Reddish, MD

## 2020-04-17 NOTE — Telephone Encounter (Signed)
Left message to return call to our office.  

## 2020-04-22 NOTE — Telephone Encounter (Signed)
Left message to return call to our office.  

## 2020-05-14 NOTE — Progress Notes (Signed)
Phone [phone] In person visit   Subjective:   Shawn Berger is a 29 y.o. year old very pleasant male patient who presents for/with See problem oriented charting Chief Complaint  Patient presents with  . Follow-up   This visit occurred during the SARS-CoV-2 public health emergency.  Safety protocols were in place, including screening questions prior to the visit, additional usage of staff PPE, and extensive cleaning of exam room while observing appropriate contact time as indicated for disinfecting solutions.   Past Medical History-  Patient Active Problem List   Diagnosis Date Noted  . 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 Asymptomatic HIV infection (HCC) 02/13/2019    Priority: High  . Narcolepsy 06/07/2018    Priority: High  . Attention deficit hyperactivity disorder (ADHD) 07/04/2007    Priority: High  . Moderate single current episode of major depressive disorder (HCC) 04/28/2016    Priority: Medium  . GAD (generalized anxiety disorder) 04/28/2016    Priority: Medium  . Asperger syndrome 01/27/2011    Priority: Medium  . Anxiety 01/27/2011    Priority: Medium  . History of syphilis 11/03/2009    Priority: Low  . SYMPTOM, SYNCOPE AND COLLAPSE 07/04/2007    Priority: Low  . 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 Pilonidal cyst with abscess 08/19/2016    Medications- reviewed and updated Current Outpatient Medications  Medication Sig Dispense Refill  . bictegravir-emtricitabine-tenofovir AF (BIKTARVY) 50-200-25 MG TABS tablet Take 1 tablet by mouth daily.    . QUEtiapine (SEROQUEL) 100 MG tablet Take 1 tablet (100 mg total) by mouth 2 (two) times daily. 60 tablet 5   No current facility-administered medications for this visit.     Objective:  BP 124/72   Pulse 87   Temp 98 F (36.7 C)   Ht 5\' 7"  (1.702 m)   Wt 185 lb (83.9 kg)   SpO2 97%   BMI 28.98 kg/m  Gen: NAD, resting comfortably CV:  RRR no murmurs rubs or gallops Lungs: CTAB no crackles, wheeze, rhonchi  Ext: no edema Skin: warm, dry     Assessment and Plan  #ADHD/anxiety/paranoia-history of methamphetamine induced psychotic disorder previously on several S: pt would like to restart ADDERALL.  We discussed once again with his history of methamphetamine induced psychotic disorder that I would advise against this.  ThankfullyHas been off methamphetamine since last visit.   He continues to suffer with auditory hallucinations as well as anxiety. Still hearing voices that are not there. Either his name or "what are you doing here?".   Plugged in with [facility]. They dont schedule appointments and he does not want to just sit in their office and wait to be seen.Also trying to get into [facility]. Ended up not getting anything on the books scheduled with psychiatry  Reviewing records from outside hospital-previously prescribedseroquel 150mg  2 times daily. States never gave the seroquel a good shot once out of the hospital-did not take consistently  Social update-Still looking for work-this is discouraging.  Mainly spending a lot of time at his apartment  Has not scheduled follow-up with Dr. for narcolepsy  Depression screen [facility] 2/9 05/18/2020 04/15/2020 02/13/2019  Decreased Interest 0 0 0  Down, Depressed, Hopeless 0 2 1  PHQ - 2 Score 0 2 1  Altered sleeping 0 2 0  Tired, decreased energy 0 2 0  Change in appetite 0 1 0  Feeling bad or failure about yourself  0 3  1  Trouble concentrating 0 1 0  Moving slowly or fidgety/restless 0 2 0  Suicidal thoughts 0 2 0  PHQ-9 Score 0 15 2  Difficult doing work/chores Not difficult at all Somewhat difficult Not difficult at all   GAD 7 : Generalized Anxiety Score 04/15/2020 06/07/2018  Nervous, Anxious, on Edge 2 3  Control/stop worrying 2 3  Worry too much - different things 2 3  Trouble relaxing 2 3  Restless 1 1  Easily annoyed or irritable 1 2  Afraid -  awful might happen 2 2  Total GAD 7 Score 12 17  Anxiety Difficulty Somewhat difficult Somewhat difficult   A/P: ADHD and narcolepsy are poorly controlled-once again I do not think primary care should prescribe Adderall for him with history of methamphetamine induced psychotic disorder-recommended for me in follow-up with psychiatry.  Recommended he follow-up with [facility] specifically.  With ongoing issues with anxiety and hallucinations and lack of psychiatry follow-up-I did prescribe Seroquel twice daily 100 mg today to see if we can improve psychiatric symptoms.  He agrees to 1 to 86-month follow-up.  He has any thoughts of self-harm he will let us know immediately.  Recommended he remain off illegal substances.  Also recommended he quit smoking  #HIV S: Patient reports good follow-up with infectious disease in [facility].  He is compliant with Biktarvy.  Appears to be with [facility] A/P: Reviewed labs from April 03, 2020-normal CBC, CMP.  STD screening negative other than known HIV.  HIV RNA trending down.  CD4 count within normal range.  Continue infectious disease follow-up and current medications-no obvious interaction between Biktarvy and Seroquel  Recommended follow up: 1-2 months again.  Lab/Order associations:   ICD-10-CM   1. Attention deficit hyperactivity disorder (ADHD), predominantly inattentive type  F90.0   2. Methamphetamine-induced psychotic disorder (HCC)  F15.959   3. Primary narcolepsy without cataplexy  G47.419   4. GAD (generalized anxiety disorder)  F41.1   5. Asymptomatic HIV infection (HCC)  Z21   6. Moderate single current episode of major depressive disorder (HCC)  F32.1     Meds ordered this encounter  Medications  . QUEtiapine (SEROQUEL) 100 MG tablet    Sig: Take 1 tablet (100 mg total) by mouth 2 (two) times daily.    Dispense:  60 tablet    Refill:  5    Return precautions advised.  Tana Conch, MD

## 2020-05-14 NOTE — Patient Instructions (Addendum)
Health Maintenance Due  Topic Date Due  . INFLUENZA VACCINE -- declines 05/03/2020   I still really want you to see psychiatry to help with management. [facility] is a very reasonable option if you cant get in elsewhere.   Lets start seroquel 100mg  twice daily and follow up in 1-2 months again. Hopefully you will be seeing psychiatry by then.   Strongly encourage you to quit smoking.   If you have any thoughts of self harm please let know immediately  Also need to schedule follow up with Dr. [place]- on the other hand seeing psychiatry if they put you on a stimulant that would treat the sleep issues as well

## 2020-05-18 ENCOUNTER — Ambulatory Visit (INDEPENDENT_AMBULATORY_CARE_PROVIDER_SITE_OTHER): Payer: Medicaid Other | Admitting: Family Medicine

## 2020-05-18 ENCOUNTER — Encounter: Payer: Self-pay | Admitting: Family Medicine

## 2020-05-18 ENCOUNTER — Other Ambulatory Visit: Payer: Self-pay

## 2020-05-18 VITALS — BP 124/72 | HR 87 | Temp 98.0°F | Ht 67.0 in | Wt 185.0 lb

## 2020-05-18 DIAGNOSIS — Z21 Asymptomatic human immunodeficiency virus [HIV] infection status: Secondary | ICD-10-CM

## 2020-05-18 DIAGNOSIS — F900 Attention-deficit hyperactivity disorder, predominantly inattentive type: Secondary | ICD-10-CM

## 2020-05-18 DIAGNOSIS — F321 Major depressive disorder, single episode, moderate: Secondary | ICD-10-CM

## 2020-05-18 DIAGNOSIS — F411 Generalized anxiety disorder: Secondary | ICD-10-CM

## 2020-05-18 DIAGNOSIS — G47419 Narcolepsy without cataplexy: Secondary | ICD-10-CM

## 2020-05-18 DIAGNOSIS — Z1159 Encounter for screening for other viral diseases: Secondary | ICD-10-CM

## 2020-05-18 DIAGNOSIS — F15959 Other stimulant use, unspecified with stimulant-induced psychotic disorder, unspecified: Secondary | ICD-10-CM

## 2020-05-18 MED ORDER — QUETIAPINE FUMARATE 100 MG PO TABS: 100.0000 mg | ORAL_TABLET | Freq: Two times a day (BID) | ORAL | 5 refills | Status: DC

## 2020-05-18 NOTE — Assessment & Plan Note (Signed)
Interestingly depression appears to be in remission but continues have anxiety issues-PHQ-9 of 0 but elevated GAD 7 score over 10

## 2020-07-30 NOTE — Progress Notes (Signed)
Phone [phone] In person visit   Subjective:   Shawn Berger is a 29 y.o. year old very pleasant male patient who presents for/with See problem oriented charting Chief Complaint  Patient presents with  . ADHD   This visit occurred during the SARS-CoV-2 public health emergency.  Safety protocols were in place, including screening questions prior to the visit, additional usage of staff PPE, and extensive cleaning of exam room while observing appropriate contact time as indicated for disinfecting solutions.   Past Medical History-  Patient Active Problem List   Diagnosis Date Noted  . 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 Asymptomatic HIV infection (HCC) 02/13/2019    Priority: High  . Narcolepsy 06/07/2018    Priority: High  . Attention deficit hyperactivity disorder (ADHD) 07/04/2007    Priority: High  . Moderate single current episode of major depressive disorder (HCC) 04/28/2016    Priority: Medium  . GAD (generalized anxiety disorder) 04/28/2016    Priority: Medium  . Asperger syndrome 01/27/2011    Priority: Medium  . Anxiety 01/27/2011    Priority: Medium  . History of syphilis 11/03/2009    Priority: Low  . SYMPTOM, SYNCOPE AND COLLAPSE 07/04/2007    Priority: Low  . 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 Pilonidal cyst with abscess 08/19/2016    Medications- reviewed and updated Current Outpatient Medications  Medication Sig Dispense Refill  . bictegravir-emtricitabine-tenofovir AF (BIKTARVY) 50-200-25 MG TABS tablet Take 1 tablet by mouth daily.    . QUEtiapine (SEROQUEL) 100 MG tablet Take 1.5 tablets (150 mg total) by mouth 2 (two) times daily. 90 tablet 5   No current facility-administered medications for this visit.     Objective:  BP 122/80   Pulse 96   Temp 98.4 F (36.9 C) (Temporal)   Resp 18   Ht 5\' 7"  (1.702 m)   Wt 203 lb (92.1 kg)   SpO2 97%   BMI 31.79 kg/m  Gen: NAD,  resting comfortably CV: RRR no murmurs rubs or gallops Lungs: CTAB no crackles, wheeze, rhonchi  Ext: no edema Skin: warm, dry    Assessment and Plan   #ADHD/paranoia/anxiety/history of methamphetamine induced psychotic disorder previously on several antipsychotics/narcolepsy S: Patient with ongoing issues with ADHD and narcolepsy as well as paranoia-patient has felt that Adderall would be beneficial for him but with history of methamphetamine induced psychotic disorder I have strongly recommended psychiatry follow-up-recommended [facility] specifically (he had previously seen but he did not like that he cannot get an appointment and that he had to wait at the visits-had also been given options of [facility]) due to insurance concerns  Last visit we did start Seroquel 100 mg twice daily (has been prescribed 150 mg twice daily by psychiatry but he did not take this consistently) which had previously been prescribed by psychiatry to see if we could improve psychiatric symptoms particularly paranoia and recommended 1 to 13-month follow-up.  He was having auditory hallucinations as well as anxiety-typically auditory hallucinations when he does say his name or "what are you doing here?".  He was to let 3-month know immediately if he had any thoughts of self-harm.  Last visit he was free from illegal substances-also recommended he quit smoking  Today, patient reports has been consistent with seroquel but has not seen uge improvement in anxiety- GAD7 score is down from 14 to 5 which appears improve but some depressed symptoms. He is hearing auditory hallucinations less frequently-  once or twice a week down from daily. [facility] Monday or Tuesday of this week- they are going to try to get him plugged in with a therapist.  denies side effects- appears weight is up. Doing some occasional walks- encouraged daily.   Social update-was discouraged last visit as was having trouble finding work.  Was  mainly spending a lot of time at his apartment. Similar situation now  Has not followed up with Dr. Vassie Loll for narcolepsy- encouraged him to follow up today   Depression screen [facility] 2/9 07/31/2020 05/18/2020 04/15/2020  Decreased Interest 0 0 0  Down, Depressed, Hopeless 2 0 2  PHQ - 2 Score 2 0 2  Altered sleeping 0 0 2  Tired, decreased energy 2 0 2  Change in appetite 0 0 1  Feeling bad or failure about yourself  0 0 3  Trouble concentrating 0 0 1  Moving slowly or fidgety/restless 0 0 2  Suicidal thoughts 0 0 2  PHQ-9 Score 4 0 15  Difficult doing work/chores - Not difficult at all Somewhat difficult   GAD 7 : Generalized Anxiety Score 07/31/2020 04/15/2020 06/07/2018  Nervous, Anxious, on Edge 2 2 3   Control/stop worrying 1 2 3   Worry too much - different things 0 2 3  Trouble relaxing 0 2 3  Restless 1 1 1   Easily annoyed or irritable 1 1 2   Afraid - awful might happen 0 2 2  Total GAD 7 Score 5 12 17   Anxiety Difficulty - Somewhat difficult Somewhat difficult   A/P: ADHD and narcolepsy poorly controlled.   - follow up with Dr. encouraged -follow up with [facility] encouraged  Depression reasonable control per phq9 but lists depressed score as 2- we will titrate up seroquel. Also still with auditory hallucinations (not clear if this is part of major depression or other underlying psychiatric disorder- need psychiatry help). Anxiety appears better but he denies significant improvenet. Titrate seroquel to 150mg  twice daily (amount psychiatry previously started with) since he is tolerating this  #HIV-had visit just 3 days ago with infectious disease at [facility].  Labs reviewed carrying chlamydia were negative.  CMP was normal.  CD4 count in normal range.  HIV RNA quantitative less than 20 copies per mL.  Lipid panel with high triglycerides at 316 but otherwise pretty reassuring-LDL was at 84 (discussed lifestyle changes). Stable/improved- Continue biktarvy.   Recommended follow  up: 1-2 month follow up  Lab/Order associations:   ICD-10-CM   1. Attention deficit hyperactivity disorder (ADHD), predominantly inattentive type  F90.0   2. Moderate single current episode of major depressive disorder (HCC)  F32.1   3. GAD (generalized anxiety disorder)  F41.1   4. Asymptomatic HIV infection (HCC)  Z21   5. Primary narcolepsy without cataplexy  G47.419     Meds ordered this encounter  Medications  . QUEtiapine (SEROQUEL) 100 MG tablet    Sig: Take 1.5 tablets (150 mg total) by mouth 2 (two) times daily.    Dispense:  90 tablet    Refill:  5   Return precautions advised.  , MD

## 2020-07-30 NOTE — Patient Instructions (Addendum)
Health Maintenance Due  Topic Date Due  . Hepatitis C Screening  Never done  . COVID-19 Vaccine (1) Has not received his vaccine. Declines for now Never done   Lets have you follow up again with [facility]- I want you to specifically ask about seeing psychiatry but I also think counseling/therapy would be great  Any worsening symptoms or any thoughts of self harm please call 911 or seek care immediately  I also want you to follow up with Dr. Vassie Loll for narcolepsy  Strongly encourage smoking cessation.   Encourage protection with sex everytime  1-2 month follow up again

## 2020-07-31 ENCOUNTER — Other Ambulatory Visit: Payer: Self-pay

## 2020-07-31 ENCOUNTER — Encounter: Payer: Self-pay | Admitting: Family Medicine

## 2020-07-31 ENCOUNTER — Ambulatory Visit (INDEPENDENT_AMBULATORY_CARE_PROVIDER_SITE_OTHER): Payer: Medicaid Other | Admitting: Family Medicine

## 2020-07-31 VITALS — BP 122/80 | HR 96 | Temp 98.4°F | Resp 18 | Ht 67.0 in | Wt 203.0 lb

## 2020-07-31 DIAGNOSIS — F411 Generalized anxiety disorder: Secondary | ICD-10-CM

## 2020-07-31 DIAGNOSIS — F321 Major depressive disorder, single episode, moderate: Secondary | ICD-10-CM

## 2020-07-31 DIAGNOSIS — G47419 Narcolepsy without cataplexy: Secondary | ICD-10-CM

## 2020-07-31 DIAGNOSIS — F900 Attention-deficit hyperactivity disorder, predominantly inattentive type: Secondary | ICD-10-CM

## 2020-07-31 DIAGNOSIS — Z21 Asymptomatic human immunodeficiency virus [HIV] infection status: Secondary | ICD-10-CM

## 2020-07-31 MED ORDER — QUETIAPINE FUMARATE 100 MG PO TABS: 150.0000 mg | ORAL_TABLET | Freq: Two times a day (BID) | ORAL | 5 refills | Status: DC

## 2020-09-10 NOTE — Progress Notes (Signed)
Phone [phone] In person visit   Subjective:   Shawn Berger is a 29 y.o. year old very pleasant male patient who presents for/with See problem oriented charting Chief Complaint  Patient presents with  . Medication Follow Up     seroquel  . ADHD  . Anxiety    This visit occurred during the SARS-CoV-2 public health emergency.  Safety protocols were in place, including screening questions prior to the visit, additional usage of staff PPE, and extensive cleaning of exam room while observing appropriate contact time as indicated for disinfecting solutions.   Past Medical History-  Patient Active Problem List   Diagnosis Date Noted  . 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 Asymptomatic HIV infection (HCC) 02/13/2019    Priority: High  . Narcolepsy 06/07/2018    Priority: High  . Attention deficit hyperactivity disorder (ADHD) 07/04/2007    Priority: High  . Moderate single current episode of major depressive disorder (HCC) 04/28/2016    Priority: Medium  . GAD (generalized anxiety disorder) 04/28/2016    Priority: Medium  . Asperger syndrome 01/27/2011    Priority: Medium  . Anxiety 01/27/2011    Priority: Medium  . History of syphilis 11/03/2009    Priority: Low  . SYMPTOM, SYNCOPE AND COLLAPSE 07/04/2007    Priority: Low  . 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 Pilonidal cyst with abscess 08/19/2016    Medications- reviewed and updated Current Outpatient Medications  Medication Sig Dispense Refill  . bictegravir-emtricitabine-tenofovir AF (BIKTARVY) 50-200-25 MG TABS tablet Take 1 tablet by mouth daily.     No current facility-administered medications for this visit.     Objective:  BP (!) 146/80   Pulse 95   Temp 98.4 F (36.9 C) (Temporal)   Ht 5\' 7"  (1.702 m)   Wt 194 lb 12.8 oz (88.4 kg)   SpO2 98%   BMI 30.51 kg/m  Gen: NAD, resting comfortably CV: RRR no murmurs rubs or  gallops Lungs: CTAB no crackles, wheeze, rhonchi Ext: no edema Skin: warm, dry    Assessment and Plan  #depression/anxiety- history of methamphetamine induced psychosis #ADD F/U S: Patient states he does not want to take seroquel (started by [facility] hospital by psychiatry) as not sure it was helping him much. Parents had been concerned about schizophrenia .   Since last visit has continued to have episodes of using methamphetamine.   He tried to call [facility] and states was not able to get plugged in- they never called him back. States did not have success with getting psychiatric care from [facility]. States was not able to get in with [facility]. [facility] for narcolepsy study.   Still at home not working. Some depressed mood and anxiety. Parents paying for where he is living. He was doing Vassie Loll but states that was discontinued. Has not been getting called back from employment. Does not drive at all. Parents drove him today- declines letting them come back for visit.   No SI. Denies auditory or visual hallucinations.  A/P: Depression still without ideal control with PHQ-9 over 5 today today.  Also reports issues with anxiety.  Fortunately no hallucinations.  No suicidal thoughts.  He does continue to use methamphetamine-recommended complete cessation.  Also recommended cessation from alcohol and smoking.  With history of methamphetamine induced psychosis and hallucinations and lack of response on Seroquel I really need psychiatry's help-placed a referral as he has had difficulty with [facility] and [facility]-also  gave him information for [facility] and hopefully can get him in.  I asked him to let me know if he is not making progress on this within the next 2 weeks and I would like to follow-up again in 1 to 2 months to check on him.  With history of hallucinations I am concerned about trying SSRIs again- had been on celexa and zoloft in the past. Patient really wants me to start ADD  medicine but with methamphetamine induced psychotic disorder think this needs to come from psychiatry  #HIV He is compliant with his HIV medicines which I thanked him for. He is sexually active and not using protection- informing partners he has HIV. Strongly encouraged protection. Discussed potential legal ramifications. He thought he did not have to share as undetectable. Appears stable- Continue current medications and ID follow up- last visit 07/28/20 with plan for 4-6 months.   #elevated BP readings S: medication: none BP Readings from Last 3 Encounters:  09/11/20 (!) 146/80  07/31/20 122/80  05/18/20 124/72  A/P: Blood pressure somewhat elevated today-have been normal the last 2 visits-we will recheck at next visit.  Quitting smoking can help lower blood pressure.  Avoiding methamphetamine can help but he denies use recently.  Had coffee this morning which could contribute.  Recommended healthy eating/regular exercise/Dash eating plan which is low in salt to see if he can bring blood pressure down   Recommended follow up: Return in about 2 months (around 11/12/2020).  Lab/Order associations:   ICD-10-CM   1. Attention deficit hyperactivity disorder (ADHD), predominantly inattentive type  F90.0 Ambulatory referral to Psychiatry  2. GAD (generalized anxiety disorder)  F41.1 Ambulatory referral to Psychiatry  3. Methamphetamine-induced psychotic disorder (HCC)  F15.959 Ambulatory referral to Psychiatry  4. Asymptomatic HIV infection (HCC)  Z21   5. Moderate single current episode of major depressive disorder (HCC)  F32.1   6. Anxiety  F41.9    Return precautions advised.  Tana Conch, MD

## 2020-09-10 NOTE — Patient Instructions (Addendum)
Health Maintenance Due  Topic Date Due  . COVID-19 Vaccine (1) patient declined Never done   [email or web address]  I am placing referral to psychiatry but often they decline referrals unless patient directly calls.  I want you to call the following number "Make an Appointment For exceptional mental health and substance abuse care close to home, call [phone]."  If you are having trouble getting set up with psychiatry definitely lean on your resources like your parents- they should be able to help.   Psychiatry help is incredibly important.  We need to help you be drug-free as well as hope you feel better overall.  im sorry the seroquel did not help you more- I am expectant they will find a medication solution that will help you with anxiety/depression and even potentially focus  Encourage full cessation from smoking, methamphetamine, alcohol.   Blood pressure slightly high today-I want you to try to exercise on a daily basis and eat a healthy diet such as Dash eating plan below and we can recheck next visit   DASH Eating Plan DASH stands for "Dietary Approaches to Stop Hypertension." The DASH eating plan is a healthy eating plan that has been shown to reduce high blood pressure (hypertension). It may also reduce your risk for type 2 diabetes, heart disease, and stroke. The DASH eating plan may also help with weight loss. What are tips for following this plan?  General guidelines  Avoid eating more than 2,300 mg (milligrams) of salt (sodium) a day. If you have hypertension, you may need to reduce your sodium intake to 1,500 mg a day.  Limit alcohol intake to no more than 1 drink a day for nonpregnant women and 2 drinks a day for men. One drink equals 12 oz of beer, 5 oz of wine, or 1 oz of hard liquor.  Work with your health care provider to maintain a healthy body weight or to lose  weight. Ask what an ideal weight is for you.  Get at least 30 minutes of exercise that causes your heart to beat faster (aerobic exercise) most days of the week. Activities may include walking, swimming, or biking.  Work with your health care provider or diet and nutrition specialist (dietitian) to adjust your eating plan to your individual calorie needs. Reading food labels   Check food labels for the amount of sodium per serving. Choose foods with less than 5 percent of the Daily Value of sodium. Generally, foods with less than 300 mg of sodium per serving fit into this eating plan.  To find whole grains, look for the word "whole" as the first word in the ingredient list. Shopping  Buy products labeled as "low-sodium" or "no salt added."  Buy fresh foods. Avoid canned foods and premade or frozen meals. Cooking  Avoid adding salt when cooking. Use salt-free seasonings or herbs instead of table salt or sea salt. Check with your health care provider or pharmacist before using salt substitutes.  Do not fry foods. Cook foods using healthy methods such as baking, boiling, grilling, and broiling instead.  Cook with heart-healthy oils, such as olive, canola, soybean, or sunflower oil. Meal planning  Eat a balanced diet that includes: ? 5 or more servings of fruits and vegetables each day. At each meal, try to fill half of your plate with fruits and vegetables. ? Up to 6-8 servings of whole grains each day. ? Less than 6 oz of lean meat, poultry, or fish each day. A 3-oz serving of  meat is about the same size as a deck of cards. One egg equals 1 oz. ? 2 servings of low-fat dairy each day. ? A serving of nuts, seeds, or beans 5 times each week. ? Heart-healthy fats. Healthy fats called Omega-3 fatty acids are found in foods such as flaxseeds and coldwater fish, like sardines, salmon, and mackerel.  Limit how much you eat of the following: ? Canned or prepackaged foods. ? Food that is high  in trans fat, such as fried foods. ? Food that is high in saturated fat, such as fatty meat. ? Sweets, desserts, sugary drinks, and other foods with added sugar. ? Full-fat dairy products.  Do not salt foods before eating.  Try to eat at least 2 vegetarian meals each week.  Eat more home-cooked food and less restaurant, buffet, and fast food.  When eating at a restaurant, ask that your food be prepared with less salt or no salt, if possible. What foods are recommended? The items listed may not be a complete list. Talk with your dietitian about what dietary choices are best for you. Grains Whole-grain or whole-wheat bread. Whole-grain or whole-wheat pasta. Brown rice. Orpah Cobb. Bulgur. Whole-grain and low-sodium cereals. Pita bread. Low-fat, low-sodium crackers. Whole-wheat flour tortillas. Vegetables Fresh or frozen vegetables (raw, steamed, roasted, or grilled). Low-sodium or reduced-sodium tomato and vegetable juice. Low-sodium or reduced-sodium tomato sauce and tomato paste. Low-sodium or reduced-sodium canned vegetables. Fruits All fresh, dried, or frozen fruit. Canned fruit in natural juice (without added sugar). Meat and other protein foods Skinless chicken or [place]. Ground chicken or [place]. Pork with fat trimmed off. Fish and seafood. Egg whites. Dried beans, peas, or lentils. Unsalted nuts, nut butters, and seeds. Unsalted canned beans. Lean cuts of beef with fat trimmed off. Low-sodium, lean deli meat. Dairy Low-fat (1%) or fat-free (skim) milk. Fat-free, low-fat, or reduced-fat cheeses. Nonfat, low-sodium ricotta or cottage cheese. Low-fat or nonfat yogurt. Low-fat, low-sodium cheese. Fats and oils Soft margarine without trans fats. Vegetable oil. Low-fat, reduced-fat, or light mayonnaise and salad dressings (reduced-sodium). Canola, safflower, olive, soybean, and sunflower oils. Avocado. Seasoning and other foods Herbs. Spices. Seasoning mixes without salt. Unsalted  popcorn and pretzels. Fat-free sweets. What foods are not recommended? The items listed may not be a complete list. Talk with your dietitian about what dietary choices are best for you. Grains Baked goods made with fat, such as croissants, muffins, or some breads. Dry pasta or rice meal packs. Vegetables Creamed or fried vegetables. Vegetables in a cheese sauce. Regular canned vegetables (not low-sodium or reduced-sodium). Regular canned tomato sauce and paste (not low-sodium or reduced-sodium). Regular tomato and vegetable juice (not low-sodium or reduced-sodium). Rosita Fire. Olives. Fruits Canned fruit in a light or heavy syrup. Fried fruit. Fruit in cream or butter sauce. Meat and other protein foods Fatty cuts of meat. Ribs. Fried meat. Tomasa Blase. Sausage. Bologna and other processed lunch meats. Salami. Fatback. Hotdogs. Bratwurst. Salted nuts and seeds. Canned beans with added salt. Canned or smoked fish. Whole eggs or egg yolks. Chicken or [place] with skin. Dairy Whole or 2% milk, cream, and half-and-half. Whole or full-fat cream cheese. Whole-fat or sweetened yogurt. Full-fat cheese. Nondairy creamers. Whipped toppings. Processed cheese and cheese spreads. Fats and oils Butter. Stick margarine. Lard. Shortening. Ghee. Bacon fat. Tropical oils, such as coconut, palm kernel, or palm oil. Seasoning and other foods Salted popcorn and pretzels. Onion salt, garlic salt, seasoned salt, table salt, and sea salt. Worcestershire sauce. Tartar sauce. Barbecue sauce. Teriyaki  sauce. Soy sauce, including reduced-sodium. Steak sauce. Canned and packaged gravies. Fish sauce. Oyster sauce. Cocktail sauce. Horseradish that you find on the shelf. Ketchup. Mustard. Meat flavorings and tenderizers. Bouillon cubes. Hot sauce and Tabasco sauce. Premade or packaged marinades. Premade or packaged taco seasonings. Relishes. Regular salad dressings. Where to find more information:  National Heart, Lung, and Blood  Institute: PopSteam.is  American Heart Association: www.heart.org Summary  The DASH eating plan is a healthy eating plan that has been shown to reduce high blood pressure (hypertension). It may also reduce your risk for type 2 diabetes, heart disease, and stroke.  With the DASH eating plan, you should limit salt (sodium) intake to 2,300 mg a day. If you have hypertension, you may need to reduce your sodium intake to 1,500 mg a day.  When on the DASH eating plan, aim to eat more fresh fruits and vegetables, whole grains, lean proteins, low-fat dairy, and heart-healthy fats.  Work with your health care provider or diet and nutrition specialist (dietitian) to adjust your eating plan to your individual calorie needs. This information is not intended to replace advice given to you by your health care provider. Make sure you discuss any questions you have with your health care provider. Document Revised: 09/01/2017 Document Reviewed: 09/12/2016 Elsevier Patient Education  2020 [employer].

## 2020-09-11 ENCOUNTER — Ambulatory Visit (INDEPENDENT_AMBULATORY_CARE_PROVIDER_SITE_OTHER): Payer: Medicaid Other | Admitting: Family Medicine

## 2020-09-11 ENCOUNTER — Other Ambulatory Visit: Payer: Self-pay

## 2020-09-11 ENCOUNTER — Encounter: Payer: Self-pay | Admitting: Family Medicine

## 2020-09-11 VITALS — BP 146/80 | HR 95 | Temp 98.4°F | Ht 67.0 in | Wt 194.8 lb

## 2020-09-11 DIAGNOSIS — F900 Attention-deficit hyperactivity disorder, predominantly inattentive type: Secondary | ICD-10-CM

## 2020-09-11 DIAGNOSIS — F15959 Other stimulant use, unspecified with stimulant-induced psychotic disorder, unspecified: Secondary | ICD-10-CM

## 2020-09-11 DIAGNOSIS — Z21 Asymptomatic human immunodeficiency virus [HIV] infection status: Secondary | ICD-10-CM

## 2020-09-11 DIAGNOSIS — F411 Generalized anxiety disorder: Secondary | ICD-10-CM

## 2020-09-11 DIAGNOSIS — F419 Anxiety disorder, unspecified: Secondary | ICD-10-CM

## 2020-09-11 DIAGNOSIS — F321 Major depressive disorder, single episode, moderate: Secondary | ICD-10-CM

## 2020-09-27 IMAGING — DX CHEST - 2 VIEW
2 series · 2 of 2 positions shown · non-contrast
Comparison: None.

CLINICAL DATA: Positive QuantiFERON TB gold test.

EXAM:
CHEST - 2 VIEW

[chest pa]
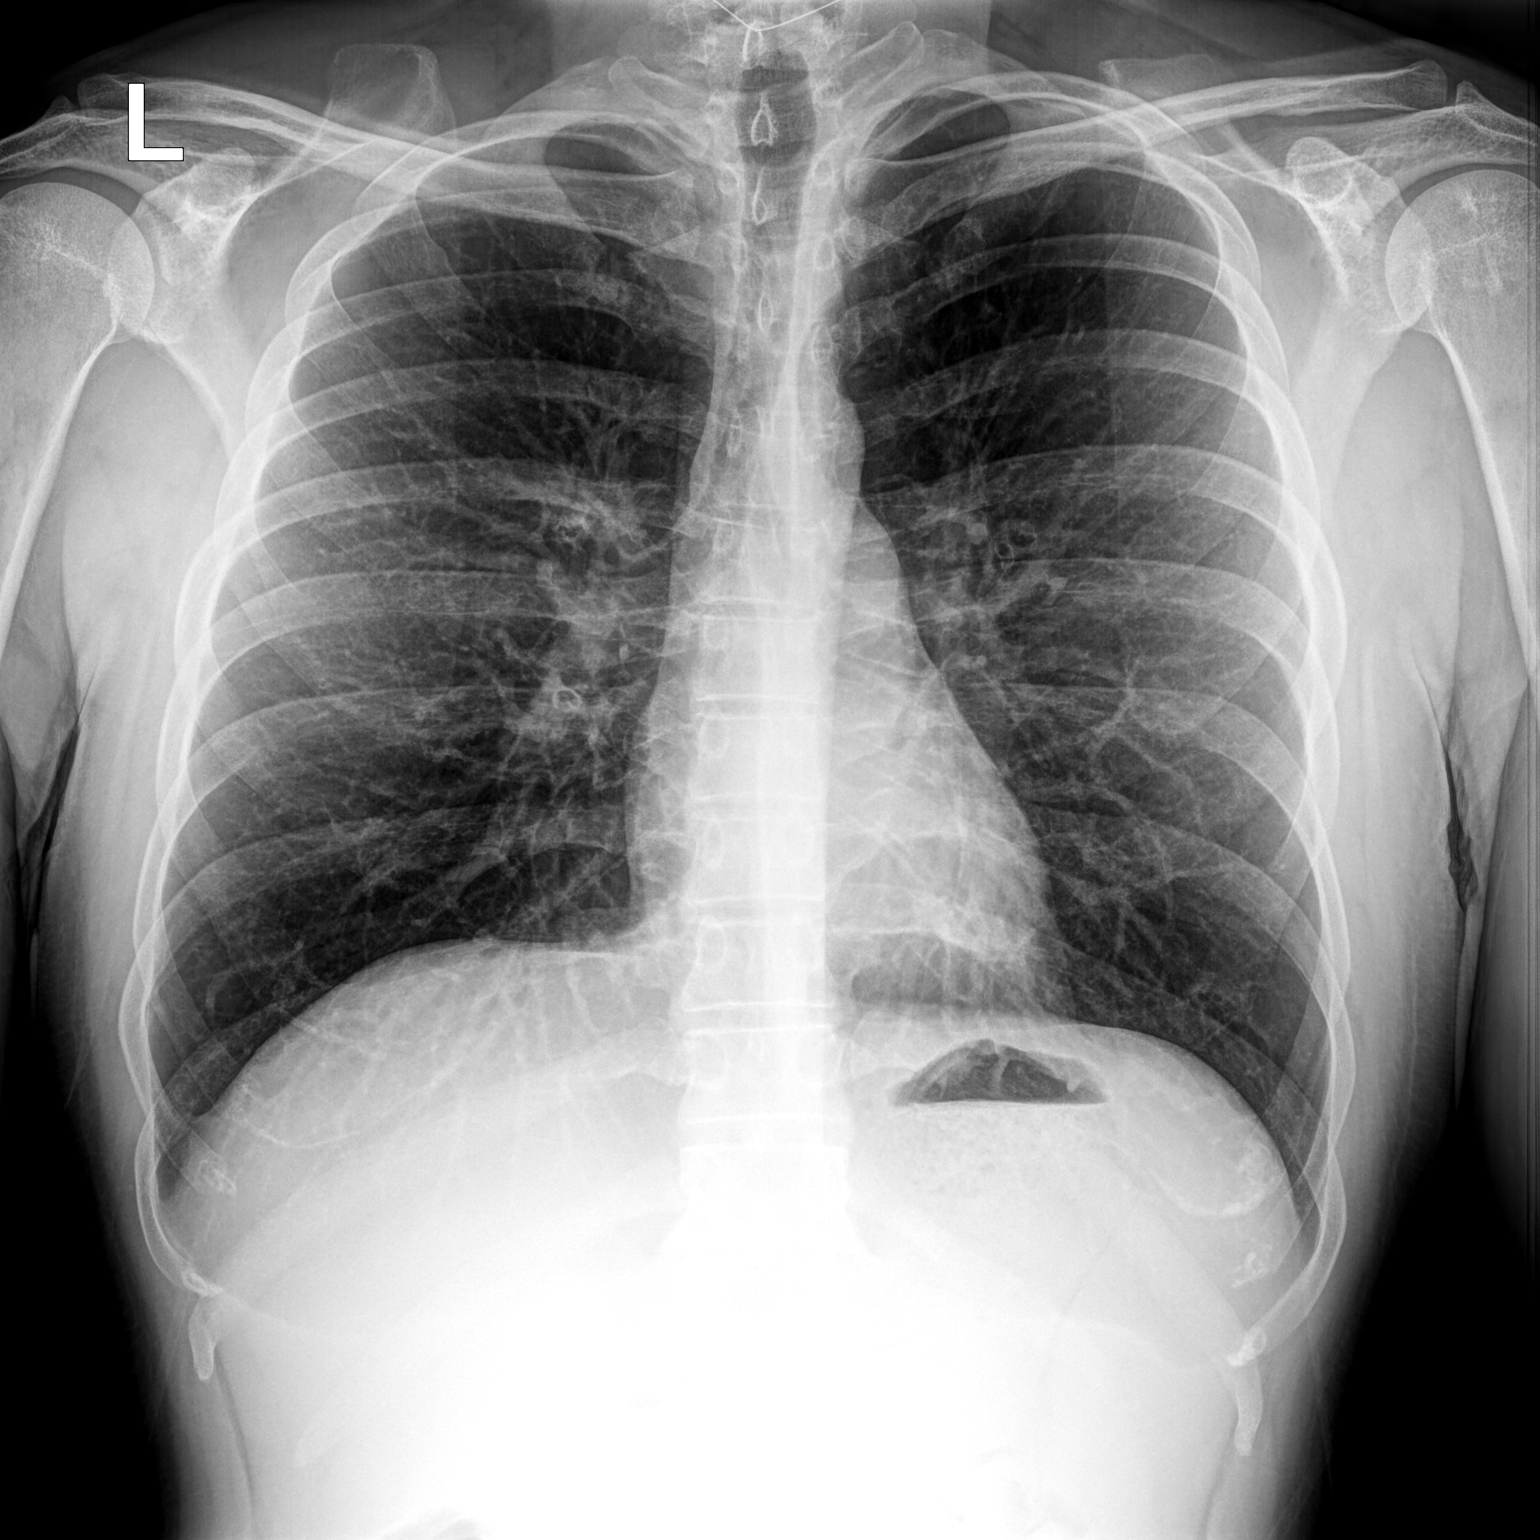

[chest lat]
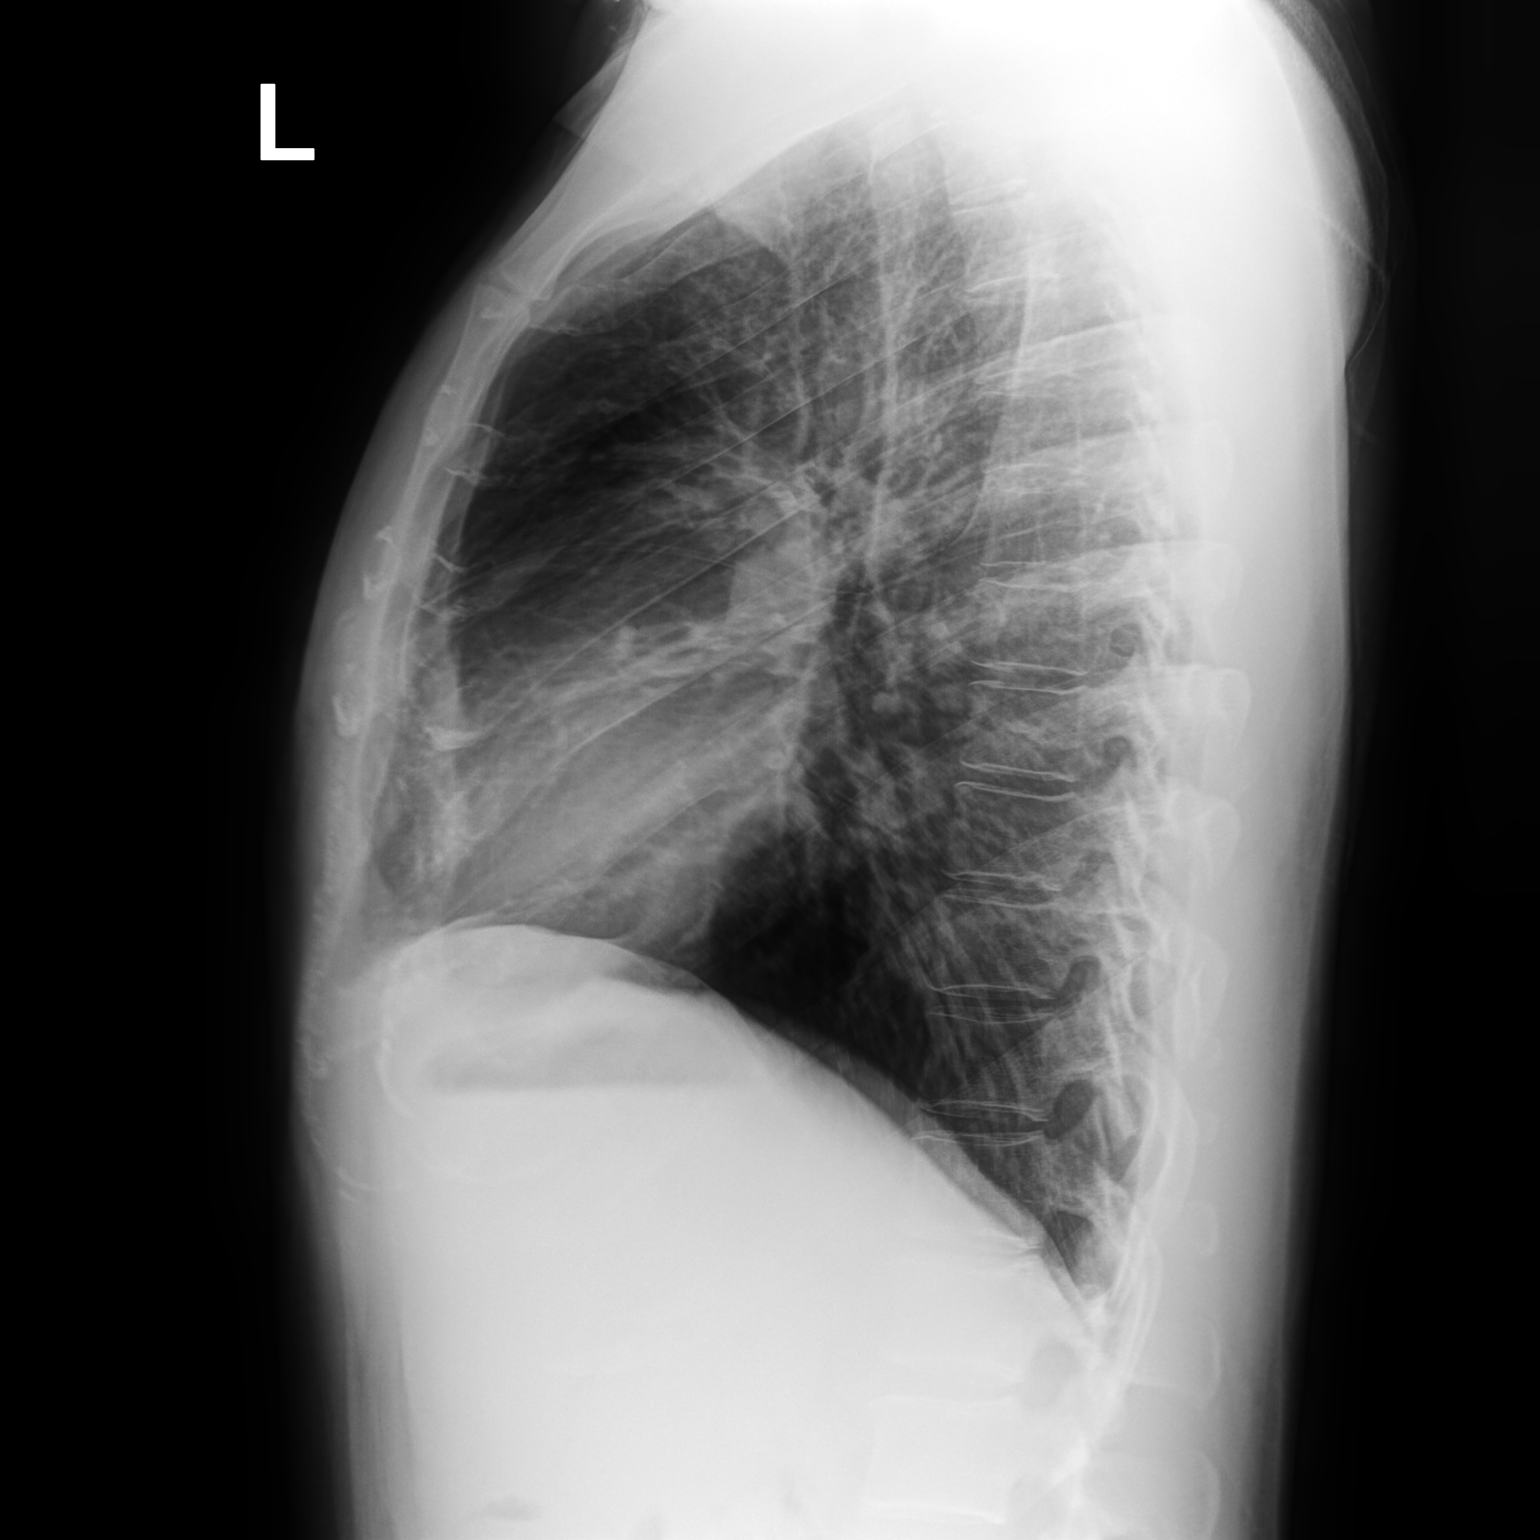

[2 of 2 positions shown; findings below may reference images not displayed]

FINDINGS: The heart size and mediastinal contours are within normal limits.
Both lungs are clear. The visualized skeletal structures are
unremarkable.
IMPRESSION: No active cardiopulmonary disease.

## 2020-11-23 NOTE — Progress Notes (Deleted)
  Phone [phone] In person visit   Subjective:   Shawn Berger is a 30 y.o. year old very pleasant male patient who presents for/with See problem oriented charting No chief complaint on file.   This visit occurred during the SARS-CoV-2 public health emergency.  Safety protocols were in place, including screening questions prior to the visit, additional usage of staff PPE, and extensive cleaning of exam room while observing appropriate contact time as indicated for disinfecting solutions.   Past Medical History-  Patient Active Problem List   Diagnosis Date Noted  . 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 Asymptomatic HIV infection (HCC) 02/13/2019  . Narcolepsy 06/07/2018  . Pilonidal cyst with abscess 08/19/2016  . Moderate single current episode of major depressive disorder (HCC) 04/28/2016  . GAD (generalized anxiety disorder) 04/28/2016  . Asperger syndrome 01/27/2011  . Anxiety 01/27/2011  . History of syphilis 11/03/2009  . Attention deficit hyperactivity disorder (ADHD) 07/04/2007  . SYMPTOM, SYNCOPE AND COLLAPSE 07/04/2007    Medications- reviewed and updated Current Outpatient Medications  Medication Sig Dispense Refill  . bictegravir-emtricitabine-tenofovir AF (BIKTARVY) 50-200-25 MG TABS tablet Take 1 tablet by mouth daily.     No current facility-administered medications for this visit.     Objective:  There were no vitals taken for this visit. Gen: NAD, resting comfortably CV: RRR no murmurs rubs or gallops Lungs: CTAB no crackles, wheeze, rhonchi Abdomen: soft/nontender/nondistended/normal bowel sounds. No rebound or guarding.  Ext: no edema Skin: warm, dry Neuro: grossly normal, moves all extremities  ***    Assessment and Plan  ADHD f/u S:***  A/P: ***     No specialty comments available.  No problem-specific Assessment & Plan notes found for this  encounter.   Recommended follow up: ***No follow-ups on file. Future Appointments  Date Time Provider Department Center  11/24/2020  4:20 PM Shelva Majestic, MD [facility]    Lab/Order associations: No diagnosis found.  No orders of the defined types were placed in this encounter.   Time Spent: *** minutes of total time (8:41 PM***- 8:41 PM***) was spent on the date of the encounter performing the following actions: chart review prior to seeing the patient, obtaining history, performing a medically necessary exam, counseling on the treatment plan, placing orders, and documenting in our EHR.   Return precautions advised.  Lieutenant Diego, CMA

## 2020-11-24 ENCOUNTER — Ambulatory Visit: Payer: Medicaid Other | Admitting: Family Medicine

## 2020-11-24 DIAGNOSIS — F900 Attention-deficit hyperactivity disorder, predominantly inattentive type: Secondary | ICD-10-CM

## 2020-11-24 NOTE — Patient Instructions (Incomplete)
Health Maintenance Due  Topic Date Due  . COVID-19 Vaccine (1) Never done    Depression screen [facility] 2/9 09/11/2020 07/31/2020 05/18/2020  Decreased Interest 0 0 0  Down, Depressed, Hopeless 2 2 0  PHQ - 2 Score 2 2 0  Altered sleeping 3 0 0  Tired, decreased energy 2 2 0  Change in appetite 0 0 0  Feeling bad or failure about yourself  1 0 0  Trouble concentrating 0 0 0  Moving slowly or fidgety/restless 0 0 0  Suicidal thoughts 0 0 0  PHQ-9 Score 8 4 0  Difficult doing work/chores Not difficult at all - Not difficult at all    Recommended follow up: No follow-ups on file.

## 2020-12-16 ENCOUNTER — Encounter: Payer: Self-pay | Admitting: Family Medicine

## 2022-06-27 ENCOUNTER — Encounter: Payer: Self-pay | Admitting: *Deleted

## 2022-09-15 ENCOUNTER — Encounter: Payer: Self-pay | Admitting: *Deleted

## 2023-03-23 ENCOUNTER — Ambulatory Visit (HOSPITAL_COMMUNITY)
Admission: EM | Admit: 2023-03-23 | Discharge: 2023-03-27 | Disposition: A | Discharge status: Other Acute Inpatient Hospital | Payer: Medicaid Other | Attending: Psychiatry | Admitting: Psychiatry

## 2023-03-23 DIAGNOSIS — Z21 Asymptomatic human immunodeficiency virus [HIV] infection status: Secondary | ICD-10-CM | POA: Insufficient documentation

## 2023-03-23 DIAGNOSIS — Z79899 Other long term (current) drug therapy: Secondary | ICD-10-CM | POA: Diagnosis not present

## 2023-03-23 DIAGNOSIS — F1290 Cannabis use, unspecified, uncomplicated: Secondary | ICD-10-CM | POA: Insufficient documentation

## 2023-03-23 DIAGNOSIS — F32A Depression, unspecified: Secondary | ICD-10-CM | POA: Insufficient documentation

## 2023-03-23 DIAGNOSIS — G47419 Narcolepsy without cataplexy: Secondary | ICD-10-CM | POA: Insufficient documentation

## 2023-03-23 DIAGNOSIS — F411 Generalized anxiety disorder: Secondary | ICD-10-CM | POA: Diagnosis not present

## 2023-03-23 DIAGNOSIS — R4689 Other symptoms and signs involving appearance and behavior: Secondary | ICD-10-CM

## 2023-03-23 DIAGNOSIS — F845 Asperger's syndrome: Secondary | ICD-10-CM | POA: Insufficient documentation

## 2023-03-23 DIAGNOSIS — F919 Conduct disorder, unspecified: Secondary | ICD-10-CM | POA: Diagnosis not present

## 2023-03-23 DIAGNOSIS — F19959 Other psychoactive substance use, unspecified with psychoactive substance-induced psychotic disorder, unspecified: Secondary | ICD-10-CM

## 2023-03-23 DIAGNOSIS — F22 Delusional disorders: Secondary | ICD-10-CM | POA: Diagnosis not present

## 2023-03-23 DIAGNOSIS — F15959 Other stimulant use, unspecified with stimulant-induced psychotic disorder, unspecified: Secondary | ICD-10-CM | POA: Diagnosis present

## 2023-03-23 DIAGNOSIS — R45851 Suicidal ideations: Secondary | ICD-10-CM | POA: Diagnosis present

## 2023-03-23 LAB — TSH: TSH: 1.589 u[IU]/mL (ref 0.350–4.500)

## 2023-03-23 LAB — COMPREHENSIVE METABOLIC PANEL
ALT: 24 U/L (ref 0–44)
AST: 25 U/L (ref 15–41)
Albumin: 4.2 g/dL (ref 3.5–5.0)
Alkaline Phosphatase: 81 U/L (ref 38–126)
Anion gap: 8 (ref 5–15)
BUN: 10 mg/dL (ref 6–20)
CO2: 25 mmol/L (ref 22–32)
Calcium: 9.5 mg/dL (ref 8.9–10.3)
Chloride: 102 mmol/L (ref 98–111)
Creatinine, Ser: 1.3 mg/dL — ABNORMAL HIGH (ref 0.61–1.24)
GFR, Estimated: >60 mL/min (ref >60)
Glucose, Bld: 100 mg/dL — ABNORMAL HIGH (ref 70–99)
Potassium: 4.4 mmol/L (ref 3.5–5.1)
Sodium: 135 mmol/L (ref 135–145)
Total Bilirubin: 1 mg/dL (ref 0.3–1.2)
Total Protein: 6.5 g/dL (ref 6.5–8.1)

## 2023-03-23 LAB — CBC WITH DIFFERENTIAL/PLATELET
Abs Immature Granulocytes: 0.01 10*3/uL (ref 0.00–0.07)
Basophils Absolute: 0 10*3/uL (ref 0.0–0.1)
Basophils Relative: 0 %
Eosinophils Absolute: 0.3 10*3/uL (ref 0.0–0.5)
Eosinophils Relative: 5 %
HCT: 42.1 % (ref 39.0–52.0)
Hemoglobin: 14.5 g/dL (ref 13.0–17.0)
Immature Granulocytes: 0 %
Lymphocytes Relative: 28 %
Lymphs Abs: 1.5 10*3/uL (ref 0.7–4.0)
MCH: 29.4 pg (ref 26.0–34.0)
MCHC: 34.4 g/dL (ref 30.0–36.0)
MCV: 85.2 fL (ref 80.0–100.0)
Monocytes Absolute: 0.4 10*3/uL (ref 0.1–1.0)
Monocytes Relative: 8 %
Neutro Abs: 3 10*3/uL (ref 1.7–7.7)
Neutrophils Relative %: 59 %
Platelets: 244 10*3/uL (ref 150–400)
RBC: 4.94 MIL/uL (ref 4.22–5.81)
RDW: 12.8 % (ref 11.5–15.5)
WBC: 5.2 10*3/uL (ref 4.0–10.5)
nRBC: 0 % (ref 0.0–0.2)

## 2023-03-23 LAB — ETHANOL: Alcohol, Ethyl (B): <10 mg/dL (ref <10)

## 2023-03-23 LAB — LIPID PANEL
Cholesterol: 109 mg/dL (ref 0–200)
HDL: 51 mg/dL (ref >40)
LDL Cholesterol: 49 mg/dL (ref 0–99)
Total CHOL/HDL Ratio: 2.1 RATIO
Triglycerides: 43 mg/dL (ref <150)
VLDL: 9 mg/dL (ref 0–40)

## 2023-03-23 LAB — HEMOGLOBIN A1C
Hgb A1c MFr Bld: 4.9 % (ref 4.8–5.6)
Mean Plasma Glucose: 93.93 mg/dL

## 2023-03-23 MED ORDER — MAGNESIUM HYDROXIDE 400 MG/5ML PO SUSP: 30.0000 mL | Freq: Every day | ORAL | Status: DC | PRN

## 2023-03-23 MED ORDER — ALUM & MAG HYDROXIDE-SIMETH 200-200-20 MG/5ML PO SUSP: 30.0000 mL | ORAL | Status: DC | PRN

## 2023-03-23 MED ORDER — ACETAMINOPHEN 325 MG PO TABS: 650.0000 mg | ORAL_TABLET | Freq: Four times a day (QID) | ORAL | Status: DC | PRN

## 2023-03-23 MED ORDER — ZIPRASIDONE MESYLATE 20 MG IM SOLR: 20.0000 mg | INTRAMUSCULAR | Status: DC | PRN

## 2023-03-23 MED ORDER — OLANZAPINE 10 MG PO TBDP
10.0000 mg | ORAL_TABLET | Freq: Three times a day (TID) | ORAL | Status: DC | PRN
  Administered 2023-03-23: 10 mg via ORAL
  Filled 2023-03-23: qty 1

## 2023-03-23 MED ORDER — LORAZEPAM 1 MG PO TABS
2.0000 mg | ORAL_TABLET | ORAL | Status: AC | PRN
  Administered 2023-03-23: 2 mg via ORAL
  Filled 2023-03-23: qty 2

## 2023-03-23 NOTE — Progress Notes (Signed)
   03/23/23 1744  [facility] Triage Screening (Walk-ins at [facility] only)  How Did You Hear About [place]? Legal System  What Is the Reason for Your Visit/Call Today? Pt presents to [facility] escorted by [employer] under IVC. Pt has erratic speech, speaking loudly, and tearful. Pt reports delusional thoughts believing that someone is stalking him and the person is in his home. Pt reports [facility] hearing a man in his home calling him back names and seeing shadows of the man. Pt reports using marijuana daily.Per IVC "Respondent has been diagnosed with aspergers sydrome and has been committed in the past. Respondent stated to his father that he should kill himself and that he keeps hearing voices. Respondent has extreme anger and has trashed the inside  of his apartment. Respondent is believed to be under the influence of an unknown substance and has abused drugs in the past. Respondent has been very aggressive toward his father and he is afraid for the respondents safety while he is in this state. Without intervention the respondent could harm himself or others.".  How Long Has This Been Causing You Problems? > than 6 months  Have You Recently Had Any Thoughts About Hurting Yourself? No  Are You Planning to Commit Suicide/Harm Yourself At This time? No  Have you Recently Had Thoughts About Hurting Someone Karolee Ohs? No  Are You Planning To Harm Someone At This Time? No  Are you currently experiencing any auditory, visual or other hallucinations? No  Have You Used Any Alcohol or Drugs in the Past 24 Hours? Yes  How long ago did you use Drugs or Alcohol? yesterday  What Did You Use and How Much? marijuana  Do you have any current medical co-morbidities that require immediate attention? No  Clinician description of patient physical appearance/behavior: alert, erratic speech  What Do You Feel Would Help You the Most Today? Treatment for Depression or other mood problem  If access to [facility] was not available, would you have sought  care in the Emergency Department? No  Determination of Need Urgent (48 hours)  Options For Referral Inpatient Hospitalization

## 2023-03-23 NOTE — BH Assessment (Signed)
Comprehensive Clinical Assessment (CCA) Note  03/23/2023 Shawn Berger [id number]  Disposition: Sindy Guadeloupe, NP, recommends continuous observation with psych reassessment in the AM.   The patient demonstrates the following risk factors for suicide: Chronic risk factors for suicide include: psychiatric disorder of depression . Acute risk factors for suicide include: family or marital conflict, unemployment, and loss (financial, interpersonal, professional). Protective factors for this patient include: hope for the future. Considering these factors, the overall suicide risk at this point appears to be high. Patient is not appropriate for outpatient follow up.  Shawn Berger is a 32 year old male presenting under IVC due to delusional thoughts and aggressive behaviors towards father. Patient denied SI, HI, psychosis and alcohol/drug usage. Patient was placed under IVC by his father.   Patient reported "they said I was delusional for protecting myself". Patient stated "I was going to the courthouse today to get a restraining order on someone named Yetta Glassman". Patient stated, "I can't explain this without sounding like I am out of my mind, there is someone after me and its him". Patient stated "no matter where I go, the bathroom, to the store, his voice is there being condescending and insulting me, bullying me, I have been bullied all my life and they just want to throw me away, he is after me no matter what". Patient reported meeting the man 4 years ago and only meeting him 2x on a dating app. Patient reported worsening depressive symptoms. Patient denied prior suicide attempts and self-harming behaviors. Patient reported 4-5 hours sleep nightly and normal appetite.   Patient denied receiving any outpatient mental health services. Patient denied being prescribed any psych medications. Patient denied inpatient psych treatment.   Patient reports living alone. Patient reports being unemployed.  Patient denied access to guns. Patient stated "just give me a psychiatrist I can meet with, I will go see them". Patient reported acknowledgement of anger outbursts as stated below in IVC and states he gets that way when he is being followed.   Per IVC, petitioner, Liliane Shi, [phone] "Respondent has been diagnosed with aspergers sydrome and has been committed in the past. Respondent stated to his father that he should kill himself and that he keeps hearing voices. Respondent has extreme anger and has trashed the inside of his apartment. Respondent is believed to be under the influence of an unknown substance and has abused drugs in the past. Respondent has been very aggressive toward his father and he is afraid for the respondents safety while he is in this state. Without intervention the respondent could harm himself or others."   Chief Complaint:  Chief Complaint  Patient presents with   IVC   Visit Diagnosis:  Psychosis    CCA Screening, Triage and Referral (STR)  Patient Reported Information How did you hear about [place]? Legal System  What Is the Reason for Your Visit/Call Today? Pt presents to [facility] escorted by [employer] under IVC. Pt has erratic speech, speaking loudly, and tearful. Pt reports delusional thoughts believing that someone is stalking him and the person is in his home. Pt reports [facility] hearing a man in his home calling him back names and seeing shadows of the man. Pt reports using marijuana daily.Per IVC "Respondent has been diagnosed with aspergers sydrome and has been committed in the past. Respondent stated to his father that he should kill himself and that he keeps hearing voices. Respondent has extreme anger and has trashed the inside  of his apartment. Respondent is  believed to be under the influence of an unknown substance and has abused drugs in the past. Respondent has been very aggressive toward his father and he is afraid for the respondents safety while he is in this  state. Without intervention the respondent could harm himself or others.".  How Long Has This Been Causing You Problems? > than 6 months  What Do You Feel Would Help You the Most Today? Treatment for Depression or other mood problem   Have You Recently Had Any Thoughts About Hurting Yourself? No  Are You Planning to Commit Suicide/Harm Yourself At This time? No   Flowsheet Row ED [facility]/23/20[facility] in [facility]  C-SSRS RISK CATEGORY No Risk       Have you Recently Had Thoughts About Hurting Someone Karolee Ohs? No  Are You Planning to Harm Someone at This Time? No  Explanation: n/a   Have You Used Any Alcohol or Drugs in the Past 24 Hours? No  What Did You Use and How Much? n/a   Do You Currently Have a Therapist/Psychiatrist? No  Name of Therapist/Psychiatrist: Name of Therapist/Psychiatrist: n/a   Have You Been Recently Discharged From Any Office Practice or Programs? No  Explanation of Discharge From Practice/Program: n/a     CCA Screening Triage Referral Assessment Type of Contact: Face-to-Face  Telemedicine Service Delivery:   Is this Initial or Reassessment?   Date Telepsych consult ordered in [facility]:    Time Telepsych consult ordered in [facility]:    Location of Assessment: [facility]  Provider Location: [facility] Assessment Services   Collateral Involvement: none reported   Does Patient Have a Automotive engineer Guardian? No  Legal Guardian Contact Information: n/a  Copy of Legal Guardianship Form: -- (n/a)  Legal Guardian Notified of Arrival: -- (n/a)  Legal Guardian Notified of Pending Discharge: -- (n/a)  If Minor and Not Living with Parent(s), Who has Custody? n/a  Is CPS involved or ever been involved? Never  Is APS involved or ever been involved? Never   Patient Determined To Be At Risk for Harm To Self or Others Based on Review of Patient Reported Information or Presenting Complaint? No  Method: No  Plan  Availability of Means: No access or NA  Intent: Vague intent or NA  Notification Required: No need or identified person  Additional Information for Danger to Others Potential: -- (n/a)  Additional Comments for Danger to Others Potential: n/a  Are There Guns or Other Weapons in Your Home? No  Types of Guns/Weapons: n/a  Are These Weapons Safely Secured?                            -- (n/a)  Who Could Verify You Are Able To Have These Secured: n/a  Do You Have any Outstanding Charges, Pending Court Dates, Parole/Probation? none reported  Contacted To Inform of Risk of Harm To Self or Others: Law Enforcement    Does Patient Present under Involuntary Commitment? Yes    [place] of Residence: [place]   Patient Currently Receiving the Following Services: Not Receiving Services   Determination of Need: Urgent (48 hours)   Options For Referral: Inpatient Hospitalization; Outpatient Therapy; Medication Management     CCA Biopsychosocial Patient Reported Schizophrenia/Schizoaffective Diagnosis in Past: No   Strengths: Self-awareness   Mental Health Symptoms Depression:   Hopelessness; Worthlessness; Change in energy/activity; Fatigue; Difficulty Concentrating   Duration of Depressive symptoms:  Duration  of Depressive Symptoms: Greater than two weeks   Mania:   Increased Energy; Racing thoughts   Anxiety:    Tension; Restlessness; Worrying   Psychosis:   Delusions   Duration of Psychotic symptoms:  Duration of Psychotic Symptoms: Less than six months   Trauma:   None   Obsessions:   None   Compulsions:   None   Inattention:   None   Hyperactivity/Impulsivity:   None   Oppositional/Defiant Behaviors:   None   Emotional Irregularity:   None   Other Mood/Personality Symptoms:   none    Mental Status Exam Appearance and self-care  Stature:   Average   Weight:   Average weight   Clothing:   Age-appropriate   Grooming:    Normal   Cosmetic use:   None   Posture/gait:   Normal   Motor activity:   Not Remarkable   Sensorium  Attention:   Normal   Concentration:   Anxiety interferes   Orientation:   X5   Recall/memory:   Normal   Affect and Mood  Affect:   Anxious   Mood:   Anxious   Relating  Eye contact:   Normal   Facial expression:   Anxious; Tense   Attitude toward examiner:   Cooperative   Thought and Language  Speech flow:  Normal   Thought content:   Appropriate to Mood and Circumstances; Delusions   Preoccupation:   None   Hallucinations:   Auditory   Organization:   Patent examiner of Knowledge:   Average   Intelligence:   Average   Abstraction:   Functional   Judgement:   Poor   Reality Testing:   Distorted   Insight:   Poor   Decision Making:   Only simple   Social Functioning  Social Maturity:   Impulsive   Social Judgement:   Naive   Stress  Stressors:   Other (Comment) ("sounds in my  head")   Coping Ability:   Overwhelmed   Skill Deficits:   Decision making   Supports:   Support needed     Religion: Religion/Spirituality Are You A Religious Person?:  (unable to assess, patient did not answer) How Might This Affect Treatment?: n/a  Leisure/Recreation: Leisure / Recreation Do You Have Hobbies?:  (unable to assess due to mental status)  Exercise/Diet: Exercise/Diet Do You Exercise?:  (unable to assess due to mental status) Have You Gained or Lost A Significant Amount of Weight in the Past Six Months?: No Do You Follow a Special Diet?: No Do You Have Any Trouble Sleeping?: Yes Explanation of Sleeping Difficulties: 4-5 hours sleep nightly   CCA Employment/Education Employment/Work Situation: Employment / Work Situation Employment Situation: Unemployed Patient's Job has Been Impacted by Current Illness:  (n/a) Has Patient ever Been in Equities trader?: No  Education: Education Is  Patient Currently Attending School?: No Last Grade Completed: 12 Did You Product manager?:  (unable to assess) Did You Have An Individualized Education Program (IIEP):  (unable to assess) Did You Have Any Difficulty At School?:  (unable to assess) Patient's Education Has Been Impacted by Current Illness:  (unable to assess)   CCA Family/Childhood History Family and Relationship History: Family history Marital status: Single Does patient have children?: No  Childhood History:  Childhood History By whom was/is the patient raised?: Mother, Father Did patient suffer any verbal/emotional/physical/sexual abuse as a child?: No Did patient suffer from severe childhood neglect?: No Has patient ever  been sexually abused/assaulted/raped as an adolescent or adult?: No Was the patient ever a victim of a crime or a disaster?: No Witnessed domestic violence?: No Has patient been affected by domestic violence as an adult?: No       CCA Substance Use Alcohol/Drug Use: Alcohol / Drug Use Pain Medications: see MAR Prescriptions: see MAR Over the Counter: see MAR History of alcohol / drug use?: No history of alcohol / drug abuse Longest period of sobriety (when/how long): n/a Negative Consequences of Use:  (n/a) Withdrawal Symptoms:  (n/a)                         ASAM's:  Six Dimensions of Multidimensional Assessment  Dimension 1:  Acute Intoxication and/or Withdrawal Potential:   Dimension 1:  Description of individual's past and current experiences of substance use and withdrawal: n/a  Dimension 2:  Biomedical Conditions and Complications:   Dimension 2:  Description of patient's biomedical conditions and  complications: n/a  Dimension 3:  Emotional, Behavioral, or Cognitive Conditions and Complications:  Dimension 3:  Description of emotional, behavioral, or cognitive conditions and complications: n/a  Dimension 4:  Readiness to Change:  Dimension 4:  Description of Readiness  to Change criteria: n/a  Dimension 5:  Relapse, Continued use, or Continued Problem Potential:  Dimension 5:  Relapse, continued use, or continued problem potential critiera description: n/a  Dimension 6:  Recovery/Living Environment:  Dimension 6:  Recovery/Iiving environment criteria description: n/a  ASAM Severity Score:    ASAM Recommended Level of Treatment: ASAM Recommended Level of Treatment:  (n/a)   Substance use Disorder (SUD) Substance Use Disorder (SUD)  Checklist Symptoms of Substance Use:  (n/a)  Recommendations for Services/Supports/Treatments: Recommendations for Services/Supports/Treatments Recommendations For Services/Supports/Treatments: Individual Therapy, Medication Management, Other (Comment) ([facility])  Discharge Disposition: Discharge Disposition Medical Exam completed: Yes  DSM5 Diagnoses: Patient Active Problem List   Diagnosis Date Noted   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   Sleep disorder, circadian, 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   Asymptomatic HIV infection (HCC) 02/13/2019   Narcolepsy 06/07/2018   Pilonidal cyst with abscess 08/19/2016   Moderate single current episode of major depressive disorder (HCC) 04/28/2016   GAD (generalized anxiety disorder) 04/28/2016   Asperger syndrome 01/27/2011   Anxiety 01/27/2011   History of syphilis 11/03/2009   Attention deficit hyperactivity disorder (ADHD) 07/04/2007   SYMPTOM, SYNCOPE AND COLLAPSE 07/04/2007     Referrals to Alternative Service(s): Referred to Alternative Service(s):   Place:   Date:   Time:    Referred to Alternative Service(s):   Place:   Date:   Time:    Referred to Alternative Service(s):   Place:   Date:   Time:    Referred to Alternative Service(s):   Place:   Date:   Time:     Burnetta Sabin, [facility]

## 2023-03-23 NOTE — ED Notes (Signed)
Patient observed/assessed in bed/chair resting but awake. Patient continuing to complete meal that he was given slowly. No distress noted at this time. Patient verbalizes no complaints at this time. Will continue to monitor.

## 2023-03-23 NOTE — ED Provider Notes (Signed)
[facility] Continuous Assessment Admission H&P  Date: 03/24/23 Patient Name: Shawn Berger MRN: [id number] Chief Complaint: under IVC for suicidal ideation an aggression   Diagnoses:  Final diagnoses:  Paranoid behavior (HCC)  Aggression aggravated  Suicidal ideation    HPI: Shawn Berger, 32 y/o with a history of anxiety and depression and narcolepsy.  Presented to [facility] via [facility] under IVC.  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  Respondent stated to his father that he should kill himself and that he keep hearing voices.  Respondents has extreme anger and has trashed the inside of his apartment.  Respondents is believed to be under the influence of an unknown substance and has abused drugs in the past.  Respondents have been very aggressive towards his father and he is afraid for the respondents safety while he is in this state.  Without intervention respondents could harm himself or others.  Per the patient he does not know why he is here, patient stated that he does hear voices every now and then but they are not telling him to do anything bad.  When asked if he has suicidal thoughts patient stated every now and then but the last time was a month ago.  Patient is diagnosed with anxiety depression but is currently not taking medicine.  Face-to-face observation of patient, patient is alert and oriented, maintaining eye contact.  Patient is a little bit anxious and angry, patient is also a little bit fidgety at times.  Patient currently denies SI but stated that he had suicidal thoughts a couple weeks ago, however is not clear if patient is forthcoming with this information because he would make pauses before he answers.  Patient denies HI, reports hearing voices every now and then but could not be specific when asked what he is hearing patient stated did not tell me anything bad.  Patient denies alcohol use.  Per the patient he  smoked marijuana on a daily basis.  Patient reports he lives alone does not work and does not access to guns. At this time it does not appear that patient is influenced by external or internal stimuli.  Writer discussed with patient that because of the circumstances why he is here we will have to admit him for observation and reevaluation in the morning.  Recommending patient observation.  Total Time spent with patient: 20 minutes  Musculoskeletal  Strength & Muscle Tone: within normal limits Gait & Station: normal Patient leans: N/A  Psychiatric Specialty Exam  Presentation General Appearance:  Bizarre  Eye Contact: Good  Speech: Clear and Coherent  Speech Volume: Normal  Handedness: Right   Mood and Affect  Mood: Anxious; Irritable  Affect: Congruent   Thought Process  Thought Processes: Coherent  Descriptions of Associations:Intact  Orientation:Full (Time, Place and Person)  Thought Content:WDL    Hallucinations:Hallucinations: Auditory Description of Auditory Hallucinations: heard voices on and off  Ideas of Reference:Paranoia  Suicidal Thoughts:Suicidal Thoughts: No  Homicidal Thoughts:Homicidal Thoughts: No   Sensorium  Memory: Immediate Fair  Judgment: Fair  Insight: Fair   Art therapist  Concentration: Fair  Attention Span: Good  Recall: Good  Fund of Knowledge: Good  Language: Good   Psychomotor Activity  Psychomotor Activity: Psychomotor Activity: Normal   Assets  Assets: Desire for Improvement; Physical Health   Sleep  Sleep: Sleep: Fair Number of Hours of Sleep: 6   Nutritional Assessment (For OBS and FBC admissions  only) Has the patient had a weight loss or gain of 10 pounds or more in the last 3 months?: No Has the patient had a decrease in food intake/or appetite?: No Does the patient have dental problems?: No Does the patient have eating habits or behaviors that may be indicators of an  eating disorder including binging or inducing vomiting?: No Has the patient recently lost weight without trying?: 0 Has the patient been eating poorly because of a decreased appetite?: 0 Malnutrition Screening Tool Score: 0    Physical Exam HENT:     Head: Normocephalic.     Nose: Nose normal.  Cardiovascular:     Rate and Rhythm: Normal rate.  Pulmonary:     Effort: Pulmonary effort is normal.  Musculoskeletal:        General: Normal range of motion.     Cervical back: Normal range of motion.  Neurological:     General: No focal deficit present.     Mental Status: He is alert.  Psychiatric:        Mood and Affect: Mood normal.        Behavior: Behavior normal.        Thought Content: Thought content normal.        Judgment: Judgment normal.    Review of Systems  Constitutional: Negative.   HENT: Negative.    Eyes: Negative.   Respiratory: Negative.    Cardiovascular: Negative.   Gastrointestinal: Negative.   Genitourinary: Negative.   Musculoskeletal: Negative.   Skin: Negative.   Neurological: Negative.   Psychiatric/Behavioral:  Positive for substance abuse. The patient is nervous/anxious.     Blood pressure (!) 140/92, pulse 81, temperature 98 F (36.7 C), temperature source Oral, resp. rate 20, SpO2 100 %. There is no height or weight on file to calculate BMI.  Past Psychiatric History: Depression, anxiety, narcolepsy  Is the patient at risk to self? Yes  Has the patient been a risk to self in the past 6 months? Yes .    Has the patient been a risk to self within the distant past? Yes   Is the patient a risk to others? Yes   Has the patient been a risk to others in the past 6 months? No   Has the patient been a risk to others within the distant past? No   Past Medical History: See chart  Family History: Unknown  Social History: Marijuana  Last Labs:  Admission on 03/23/2023  Component Date Value Ref Range Status   WBC 03/23/2023 5.2  4.0 - 10.5 K/uL  Final   RBC 03/23/2023 4.94  4.22 - 5.81 MIL/uL Final   Hemoglobin 03/23/2023 14.5  13.0 - 17.0 g/dL Final   HCT 40/98/1191 42.1  39.0 - 52.0 % Final   MCV 03/23/2023 85.2  80.0 - 100.0 fL Final   MCH 03/23/2023 29.4  26.0 - 34.0 pg Final   MCHC 03/23/2023 34.4  30.0 - 36.0 g/dL Final   RDW 47/82/9562 12.8  11.5 - 15.5 % Final   Platelets 03/23/2023 244  150 - 400 K/uL Final   nRBC 03/23/2023 0.0  0.0 - 0.2 % Final   Neutrophils Relative % 03/23/2023 59  % Final   Neutro Abs 03/23/2023 3.0  1.7 - 7.7 K/uL Final   Lymphocytes Relative 03/23/2023 28  % Final   Lymphs Abs 03/23/2023 1.5  0.7 - 4.0 K/uL Final   Monocytes Relative 03/23/2023 8  % Final   Monocytes Absolute 03/23/2023 0.4  0.1 - 1.0 K/uL Final   Eosinophils Relative 03/23/2023 5  % Final   Eosinophils Absolute 03/23/2023 0.3  0.0 - 0.5 K/uL Final   Basophils Relative 03/23/2023 0  % Final   Basophils Absolute 03/23/2023 0.0  0.0 - 0.1 K/uL Final   Immature Granulocytes 03/23/2023 0  % Final   Abs Immature Granulocytes 03/23/2023 0.01  0.00 - 0.07 K/uL Final   Performed at [facility], [address] [place], [place] [address]   Sodium 03/23/2023 135  135 - 145 mmol/L Final   Potassium 03/23/2023 4.4  3.5 - 5.1 mmol/L Final   Chloride 03/23/2023 102  98 - 111 mmol/L Final   CO2 03/23/2023 25  22 - 32 mmol/L Final   Glucose, Bld 03/23/2023 100 (H)  70 - 99 mg/dL Final   Glucose reference range applies only to samples taken after fasting for at least 8 hours.   BUN 03/23/2023 10  6 - 20 mg/dL Final   Creatinine, Ser 03/23/2023 1.30 (H)  0.61 - 1.24 mg/dL Final   Calcium 60/45/4098 9.5  8.9 - 10.3 mg/dL Final   Total Protein 11/91/4782 6.5  6.5 - 8.1 g/dL Final   Albumin 95/62/1308 4.2  3.5 - 5.0 g/dL Final   AST 65/78/4696 25  15 - 41 U/L Final   ALT 03/23/2023 24  0 - 44 U/L Final   Alkaline Phosphatase 03/23/2023 81  38 - 126 U/L Final   Total Bilirubin 03/23/2023 1.0  0.3 - 1.2 mg/dL Final   GFR, Estimated  03/23/2023 >60  >60 mL/min Final   Comment: (NOTE) Calculated using the CKD-EPI Creatinine Equation (2021)    Anion gap 03/23/2023 8  5 - 15 Final   Performed at [facility], [address] [place], [place] [address]   Hgb A1c MFr Bld 03/23/2023 4.9  4.8 - 5.6 % Final   Comment: (NOTE) Pre diabetes:          5.7%-6.4%  Diabetes:              >6.4%  Glycemic control for   <7.0% adults with diabetes    Mean Plasma Glucose 03/23/2023 93.93  mg/dL Final   Performed at [facility], [address], [place], [place] [address]   Alcohol, Ethyl (B) 03/23/2023 <10  <10 mg/dL Final   Comment: (NOTE) Lowest detectable limit for serum alcohol is 10 mg/dL.  For medical purposes only. Performed at [facility], [address], [place], [place] [address]    Cholesterol 03/23/2023 109  0 - 200 mg/dL Final   Triglycerides 72/53/6644 43  <150 mg/dL Final   HDL 03/47/4259 51  >40 mg/dL Final   Total CHOL/HDL Ratio 03/23/2023 2.1  RATIO Final   VLDL 03/23/2023 9  0 - 40 mg/dL Final   LDL Cholesterol 03/23/2023 49  0 - 99 mg/dL Final   Comment:        Total Cholesterol/HDL:CHD Risk Coronary Heart Disease Risk Table                     Men   Women  1/2 Average Risk   3.4   3.3  Average Risk       5.0   4.4  2 X Average Risk   9.6   7.1  3 X Average Risk  23.4   11.0        Use the calculated Patient Ratio above and the CHD  Risk Table to determine the patient's CHD Risk.        ATP III CLASSIFICATION (LDL):  <100     mg/dL   Optimal  161-096  mg/dL   Near or Above                    Optimal  130-159  mg/dL   Borderline  045-409  mg/dL   High  >811     mg/dL   Very High Performed at [facility], [address], [place], [place] [address]    TSH 03/23/2023 1.589  0.350 - 4.500 uIU/mL Final   Comment: Performed by a 3rd Generation assay with a functional sensitivity of <=0.01 uIU/mL. Performed at [facility], [address], [place], [place] [address]      Allergies: Patient has no known allergies.  Medications:  Facility Ordered Medications  Medication   acetaminophen (TYLENOL) tablet 650 mg   alum & mag hydroxide-simeth (MAALOX/MYLANTA) 200-200-20 MG/5ML suspension 30 mL   magnesium hydroxide (MILK OF MAGNESIA) suspension 30 mL   OLANZapine zydis (ZYPREXA) disintegrating tablet 10 mg   And   [COMPLETED] LORazepam (ATIVAN) tablet 2 mg   And   ziprasidone (GEODON) injection 20 mg   bictegravir-emtricitabine-tenofovir AF (BIKTARVY) 50-200-25 MG per tablet 1 tablet   PTA Medications  Medication Sig   bictegravir-emtricitabine-tenofovir AF (BIKTARVY) 50-200-25 MG TABS tablet Take 1 tablet by mouth daily.      Medical Decision Making  Inpatient observation Meds ordered this encounter  Medications   acetaminophen (TYLENOL) tablet 650 mg   alum & mag hydroxide-simeth (MAALOX/MYLANTA) 200-200-20 MG/5ML suspension 30 mL   magnesium hydroxide (MILK OF MAGNESIA) suspension 30 mL   AND Linked Order Group    OLANZapine zydis (ZYPREXA) disintegrating tablet 10 mg    LORazepam (ATIVAN) tablet 2 mg    ziprasidone (GEODON) injection 20 mg   bictegravir-emtricitabine-tenofovir AF (BIKTARVY) 50-200-25 MG per tablet 1 tablet    Lab Orders         CBC with Differential/Platelet         Comprehensive metabolic panel         Hemoglobin A1c         Ethanol         Lipid panel         TSH         POCT Urine Drug Screen - (I-Screen)        Recommendations  Based on my evaluation the patient appears to have an emergency medical condition for which I recommend the patient be transferred to the emergency department for further evaluation.  Sindy Guadeloupe, NP 03/24/23  5:23 AM

## 2023-03-24 ENCOUNTER — Encounter (HOSPITAL_COMMUNITY): Payer: Self-pay | Admitting: Emergency Medicine

## 2023-03-24 DIAGNOSIS — R45851 Suicidal ideations: Secondary | ICD-10-CM | POA: Diagnosis not present

## 2023-03-24 LAB — POCT URINE DRUG SCREEN - MANUAL ENTRY (I-SCREEN)
POC Amphetamine UR: POSITIVE — AB
POC Buprenorphine (BUP): NOT DETECTED
POC Cocaine UR: NOT DETECTED
POC Marijuana UR: POSITIVE — AB
POC Methadone UR: NOT DETECTED
POC Methamphetamine UR: POSITIVE — AB
POC Morphine: NOT DETECTED
POC Oxazepam (BZO): POSITIVE — AB
POC Oxycodone UR: NOT DETECTED
POC Secobarbital (BAR): NOT DETECTED

## 2023-03-24 MED ORDER — HALOPERIDOL 5 MG PO TABS: 5.0000 mg | ORAL_TABLET | ORAL | Status: DC | PRN

## 2023-03-24 MED ORDER — HALOPERIDOL LACTATE 5 MG/ML IJ SOLN
5.0000 mg | INTRAMUSCULAR | Status: DC | PRN
  Administered 2023-03-24: 5 mg via INTRAMUSCULAR
  Filled 2023-03-24: qty 1

## 2023-03-24 MED ORDER — QUETIAPINE FUMARATE 100 MG PO TABS
100.0000 mg | ORAL_TABLET | Freq: Two times a day (BID) | ORAL | Status: DC
  Administered 2023-03-26 – 2023-03-27 (×2): 100 mg via ORAL
  Filled 2023-03-24 (×4): qty 1

## 2023-03-24 MED ORDER — LORAZEPAM 2 MG/ML IJ SOLN
2.0000 mg | INTRAMUSCULAR | Status: DC | PRN
  Administered 2023-03-24: 2 mg via INTRAMUSCULAR
  Filled 2023-03-24: qty 1

## 2023-03-24 MED ORDER — LORAZEPAM 1 MG PO TABS: 2.0000 mg | ORAL_TABLET | ORAL | Status: DC | PRN

## 2023-03-24 MED ORDER — BENZTROPINE MESYLATE 1 MG/ML IJ SOLN
1.0000 mg | INTRAMUSCULAR | Status: DC | PRN
  Administered 2023-03-24: 1 mg via INTRAMUSCULAR
  Filled 2023-03-24: qty 2

## 2023-03-24 MED ORDER — BICTEGRAVIR-EMTRICITAB-TENOFOV 50-200-25 MG PO TABS
1.0000 | ORAL_TABLET | Freq: Every day | ORAL | Status: DC
  Administered 2023-03-24 – 2023-03-27 (×3): 1 via ORAL
  Filled 2023-03-24: qty 3
  Filled 2023-03-24 (×3): qty 1

## 2023-03-24 MED ORDER — BENZTROPINE MESYLATE 1 MG PO TABS: 1.0000 mg | ORAL_TABLET | ORAL | Status: DC | PRN

## 2023-03-24 NOTE — ED Notes (Signed)
Pt sleeping@this time. Breathing even and unlabored. Will continue to monitor or safety 

## 2023-03-24 NOTE — ED Notes (Signed)
Patient agitated, yelling and cursing.  Patient hitting the walls and kicking the beds. Slammed the door to the bathroom.  Patient upset and angry he is still here. Explained to patient that he is here under IVC and we cannot allow him to leave but a provider will be around in the am.  PRNs given.  Patient argued but did take them willingly - will continue to monitor for safety

## 2023-03-24 NOTE — Progress Notes (Signed)
LCSW Progress Note  [id number]   Shawn Berger  03/24/2023  12:38 PM  Description:   Inpatient Psychiatric Referral  Patient was recommended inpatient per Karie Fetch, MD. There are no available beds at [facility], per [facility], RN. Patient was referred to the following out of network facilities:   Destination  Service Provider Address Phone Fax  [facility]  [address], [place] [address] [phone]  [facility] [address] [place]  [address] [place], [place] [address] [phone]  [facility] [address] [place]  [address] [place] [address] [phone]  [facility]  [address] [place], [place] [address] [phone]  [facility]  [address] [place] [address] [phone]  [facility]  [address] [place] [address] [phone]  [facility]  [address] [place] [address] [phone]  [facility]  [address] [place], [place] [address] [phone]  [facility]  [address] [place] [address] [phone]  [facility]  [address] [place] [address] [phone]  [facility]  [address] [place] [address] [phone]  [facility]  [address] [place] [address] [phone]  [facility]  [address], [place] [address] [phone]  [facility]  [address] [place] [address] [phone]  [facility]  [address] [place] [address] [phone]  [facility]  [address], [place] [address] [phone]  [facility]  [address] [place] [address] [phone]  [facility]  [address], [place] [address] [phone]  [facility]  [address], [place] [address] [phone]  [facility]  [address] [place], [place] [address] [phone]  [facility]  [address], [place] [address] [phone]  [facility]  [address] [place] [address] [phone]  [facility]  [address] [place] [address] [phone]  [facility]  [address] [place] [address] [phone]  [facility] [address]  [address] [place] [address] [phone]    Situation ongoing, CSW to continue following and update chart as more information becomes available.      Cathie Beams, [place]  03/24/2023 12:38 PM

## 2023-03-24 NOTE — ED Notes (Signed)
Pt sleeping in no acute distress. RR even and unlabored. Environment secured. Will continue to monitor for safety. 

## 2023-03-24 NOTE — ED Notes (Signed)
Pt is currently sleeping, no distress noted, environmental check complete, will continue to monitor patient for safety.  

## 2023-03-24 NOTE — ED Provider Notes (Signed)
Behavioral Health Progress Note  Date and Time: 03/24/2023 1:12 PM Name: Shawn Berger MRN:  [id number]  Subjective:  Mr. 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   Per IVC petition, "Respondent has been diagnosed with aspergers sydrome and has been committed in the past. Respondent stated to his father that he should kill himself and that he keeps hearing voices. Respondent has extreme anger and has trashed the inside of his apartment. Respondent is believed to be under the influence of an unknown substance and has abused drugs in the past. Respondent has been very aggressive toward his father and he is afraid for the respondents safety while he is in this state. Without intervention the respondent could harm himself or others.".   On AM reassessment, patient continues to report that Shawn Berger is after him. He reports that he continues to intend to go to the courts to file a restraining order against this individual. He reports that this is someone he met twice 4 years ago. He reports continuing to hear his voice, that it's always saying something mean like "why are you standing like that?" Or "you look dumb doing that." He states that he's been hearing his voice for the past 6 months, it started around Christmas. He reports that he sees his shadow at home and everywhere else. When attempting to reality test with patient and offer patient medication, patient becomes irritable and verbally abusive towards this Clinical research associate.   Discussed patient's UDS+amphetamine, oxazepam, meth, MJ. Patient denies any substance use and states that he doesn't know how these substances got into his system.   Patient denies any depressive symptoms, manic symptoms, anxiety. He denies that he has auditory or visual hallucinations. He  reports past prior of seroquel but reports it didn't help and he declines any medications at this time. He reports that he lives in an apartment by himself.   Per collateral phone call with dad and IVC petitioner Shawn Berger, patient has a history of Asperger's and gets into these states of paranoia. He reports that he went to the patient's apartment and the patient had trashed the apartment looking for this person in the mattress. He reports that there may be some illegal drugs involved. Dad reports patient previously lived with him for 2 years and was mostly stable but last September dad supported him in moving to his own apartment. He reports things have gone downhill since then. He reports that he lost the job that he had for 3 months. He reports that he continues to have this delusion that a person is stalking him. He reports that when he went to visit patient at the apartment in the last couple of weeks, he noticed broken glass in the bedroom, holes in the mattress, "crap" in the bathroom, food thrown around the house. He reports there was expired food in the fridge. He states patient was stating that he might as well die and told his dad that he hoped his dad would die as well. He also reports patient had delusions when he saw people on the street, for example, if he saw an [place] American he would state that they were really white in disguise. He reports patient was extremely paranoid. He reports patient's past psychiatric hospitalization in 2020. He reports patient often self-medicates with marijuana. The only Rx he is  aware of is that the patient was prescribed adderall years ago when he was seeing a psychiatrist.   Per chart review, he was previously admitted to [facility] from 11/19-11/25/2020 and diagnosed with methamphetamine-induced psychotic disorder and discharged on seroquel 150 BID for 30 days.   Diagnosis:  Final diagnoses:  Paranoid behavior (HCC)  Aggression aggravated  Suicidal  ideation   Total Time spent with patient: 1 hour  Past Psychiatric History: Asperger's, methamphetamine-induced psychosis  Past Medical History: HIV, history of syphilis treated by health department in 11/2009 Family History: non-contributory Family Psychiatric  History: reports sister with depression Social History: lives alone in apartment. Unemployed, previously worked for [employer] for 3 months.   Additional Social History:    Pain Medications: see MAR Prescriptions: see MAR Over the Counter: see MAR History of alcohol / drug use?: No history of alcohol / drug abuse Longest period of sobriety (when/how long): n/a Negative Consequences of Use:  (n/a) Withdrawal Symptoms:  (n/a)                    Sleep: Fair  Appetite:  Fair  Current Medications:  Current Facility-Administered Medications  Medication Dose Route Frequency Provider Last Rate Last Admin   acetaminophen (TYLENOL) tablet 650 mg  650 mg Oral Q6H PRN Sindy Guadeloupe, NP       alum & mag hydroxide-simeth (MAALOX/MYLANTA) 200-200-20 MG/5ML suspension 30 mL  30 mL Oral Q4H PRN Sindy Guadeloupe, NP       bictegravir-emtricitabine-tenofovir AF (BIKTARVY) 50-200-25 MG per tablet 1 tablet  1 tablet Oral Daily Sindy Guadeloupe, NP   1 tablet at 03/24/23 7858   magnesium hydroxide (MILK OF MAGNESIA) suspension 30 mL  30 mL Oral Daily PRN Sindy Guadeloupe, NP       OLANZapine zydis (ZYPREXA) disintegrating tablet 10 mg  10 mg Oral Q8H PRN Sindy Guadeloupe, NP   10 mg at 03/23/23 2206   And   ziprasidone (GEODON) injection 20 mg  20 mg Intramuscular PRN Sindy Guadeloupe, NP       Current Outpatient Medications  Medication Sig Dispense Refill   bictegravir-emtricitabine-tenofovir AF (BIKTARVY) 50-200-25 MG TABS tablet Take 1 tablet by mouth daily.      Labs  Lab Results:  Admission on 03/23/2023  Component Date Value Ref Range Status   WBC 03/23/2023 5.2  4.0 - 10.5 K/uL Final   RBC 03/23/2023 4.94  4.22 - 5.81  MIL/uL Final   Hemoglobin 03/23/2023 14.5  13.0 - 17.0 g/dL Final   HCT 85/11/7739 42.1  39.0 - 52.0 % Final   MCV 03/23/2023 85.2  80.0 - 100.0 fL Final   MCH 03/23/2023 29.4  26.0 - 34.0 pg Final   MCHC 03/23/2023 34.4  30.0 - 36.0 g/dL Final   RDW 28/78/6767 12.8  11.5 - 15.5 % Final   Platelets 03/23/2023 244  150 - 400 K/uL Final   nRBC 03/23/2023 0.0  0.0 - 0.2 % Final   Neutrophils Relative % 03/23/2023 59  % Final   Neutro Abs 03/23/2023 3.0  1.7 - 7.7 K/uL Final   Lymphocytes Relative 03/23/2023 28  % Final   Lymphs Abs 03/23/2023 1.5  0.7 - 4.0 K/uL Final   Monocytes Relative 03/23/2023 8  % Final   Monocytes Absolute 03/23/2023 0.4  0.1 - 1.0 K/uL Final   Eosinophils Relative 03/23/2023 5  % Final   Eosinophils Absolute 03/23/2023 0.3  0.0 - 0.5 K/uL Final   Basophils Relative 03/23/2023 0  %  Final   Basophils Absolute 03/23/2023 0.0  0.0 - 0.1 K/uL Final   Immature Granulocytes 03/23/2023 0  % Final   Abs Immature Granulocytes 03/23/2023 0.01  0.00 - 0.07 K/uL Final   Performed at [facility], [address], [place], [place] [address]   Sodium 03/23/2023 135  135 - 145 mmol/L Final   Potassium 03/23/2023 4.4  3.5 - 5.1 mmol/L Final   Chloride 03/23/2023 102  98 - 111 mmol/L Final   CO2 03/23/2023 25  22 - 32 mmol/L Final   Glucose, Bld 03/23/2023 100 (H)  70 - 99 mg/dL Final   Glucose reference range applies only to samples taken after fasting for at least 8 hours.   BUN 03/23/2023 10  6 - 20 mg/dL Final   Creatinine, Ser 03/23/2023 1.30 (H)  0.61 - 1.24 mg/dL Final   Calcium 60/45/4098 9.5  8.9 - 10.3 mg/dL Final   Total Protein 11/91/4782 6.5  6.5 - 8.1 g/dL Final   Albumin 95/62/1308 4.2  3.5 - 5.0 g/dL Final   AST 65/78/4696 25  15 - 41 U/L Final   ALT 03/23/2023 24  0 - 44 U/L Final   Alkaline Phosphatase 03/23/2023 81  38 - 126 U/L Final   Total Bilirubin 03/23/2023 1.0  0.3 - 1.2 mg/dL Final   GFR, Estimated 03/23/2023 >60  >60 mL/min Final   Comment:  (NOTE) Calculated using the CKD-EPI Creatinine Equation (2021)    Anion gap 03/23/2023 8  5 - 15 Final   Performed at [facility], [address], [place], [place] [address]   Hgb A1c MFr Bld 03/23/2023 4.9  4.8 - 5.6 % Final   Comment: (NOTE) Pre diabetes:          5.7%-6.4%  Diabetes:              >6.4%  Glycemic control for   <7.0% adults with diabetes    Mean Plasma Glucose 03/23/2023 93.93  mg/dL Final   Performed at [facility], [address] [place], [place] [address]   Alcohol, Ethyl (B) 03/23/2023 <10  <10 mg/dL Final   Comment: (NOTE) Lowest detectable limit for serum alcohol is 10 mg/dL.  For medical purposes only. Performed at [facility], [address] [place], [place] [address]    Cholesterol 03/23/2023 109  0 - 200 mg/dL Final   Triglycerides 72/53/6644 43  <150 mg/dL Final   HDL 03/47/4259 51  >40 mg/dL Final   Total CHOL/HDL Ratio 03/23/2023 2.1  RATIO Final   VLDL 03/23/2023 9  0 - 40 mg/dL Final   LDL Cholesterol 03/23/2023 49  0 - 99 mg/dL Final   Comment:        Total Cholesterol/HDL:CHD Risk Coronary Heart Disease Risk Table                     Men   Women  1/2 Average Risk   3.4   3.3  Average Risk       5.0   4.4  2 X Average Risk   9.6   7.1  3 X Average Risk  23.4   11.0        Use the calculated Patient Ratio above and the CHD Risk Table to determine the patient's CHD Risk.        ATP III CLASSIFICATION (LDL):  <100     mg/dL   Optimal  563-875  mg/dL  Near or Above                    Optimal  130-159  mg/dL   Borderline  161-096  mg/dL   High  >045     mg/dL   Very High Performed at [facility], [address], [place], [place] [address]    POC Amphetamine UR 03/24/2023 Positive (A)  NONE DETECTED (Cut Off Level 1000 ng/mL) Final   POC Secobarbital (BAR) 03/24/2023 None Detected  NONE DETECTED (Cut Off Level 300 ng/mL) Final   POC Buprenorphine (BUP) 03/24/2023 None Detected  NONE DETECTED (Cut Off  Level 10 ng/mL) Final   POC Oxazepam (BZO) 03/24/2023 Positive (A)  NONE DETECTED (Cut Off Level 300 ng/mL) Final   POC Cocaine UR 03/24/2023 None Detected  NONE DETECTED (Cut Off Level 300 ng/mL) Final   POC Methamphetamine UR 03/24/2023 Positive (A)  NONE DETECTED (Cut Off Level 1000 ng/mL) Final   POC Morphine 03/24/2023 None Detected  NONE DETECTED (Cut Off Level 300 ng/mL) Final   POC Methadone UR 03/24/2023 None Detected  NONE DETECTED (Cut Off Level 300 ng/mL) Final   POC Oxycodone UR 03/24/2023 None Detected  NONE DETECTED (Cut Off Level 100 ng/mL) Final   POC Marijuana UR 03/24/2023 Positive (A)  NONE DETECTED (Cut Off Level 50 ng/mL) Final   TSH 03/23/2023 1.589  0.350 - 4.500 uIU/mL Final   Comment: Performed by a 3rd Generation assay with a functional sensitivity of <=0.01 uIU/mL. Performed at [facility], [address], [place], [place] [address]     Blood Alcohol level:  Lab Results  Component Value Date   ETH <10 03/23/2023    Metabolic Disorder Labs: Lab Results  Component Value Date   HGBA1C 4.9 03/23/2023   MPG 93.93 03/23/2023   No results found for: "PROLACTIN" Lab Results  Component Value Date   CHOL 109 03/23/2023   TRIG 43 03/23/2023   HDL 51 03/23/2023   CHOLHDL 2.1 03/23/2023   VLDL 9 03/23/2023   LDLCALC 49 03/23/2023    Therapeutic Lab Levels: No results found for: "LITHIUM" No results found for: "VALPROATE" No results found for: "CBMZ"  Physical Findings   GAD-7    Flowsheet Row Office Visit from 07/31/2020 in [facility] from 04/15/2020 in [facility] Office Visit from 06/07/2018 in [facility]  Total GAD-7 Score 5 12 17       PHQ2-9    Flowsheet Row Office Visit from 09/11/2020 in [facility] Office Visit from 07/31/2020 in [facility] from 05/18/2020 in [facility] from 04/15/2020 in [facility] from 02/13/2019 in [facility]  PHQ-2 Total Score 2 2 0 2 1  PHQ-9 Total Score 8 4 0 15 2      Flowsheet Row ED from 03/23/2023 in [facility]  C-SSRS RISK CATEGORY No Risk        Musculoskeletal  Strength & Muscle Tone: within normal limits Gait & Station: normal Patient leans: N/A  Psychiatric Specialty Exam  Presentation  General Appearance:  Disheveled  Eye Contact: Fair  Speech: Garbled  Speech Volume: Increased  Handedness: Right   Mood and Affect  Mood: Irritable; Angry  Affect: Congruent; Labile   Thought Process  Thought Processes: Coherent  Descriptions of Associations:Intact  Orientation:Full (Time, Place and Person)  Thought  Content:Delusions; Paranoid Ideation; Illogical  Diagnosis of Schizophrenia or Schizoaffective disorder in past: No  Duration of Psychotic Symptoms: Greater than six months   Hallucinations:Hallucinations: Auditory; Visual Description of Auditory Hallucinations: hearing voice of a man Materials engineer) talking to him Description of Visual Hallucinations: seeing shadows around the home  Ideas of Reference:Paranoia; Delusions  Suicidal Thoughts:Suicidal Thoughts: No  Homicidal Thoughts:Homicidal Thoughts: No   Sensorium  Memory: Immediate Fair  Judgment: Poor  Insight: Poor   Executive Functions  Concentration: Fair  Attention Span: Good  Recall: Good  Fund of Knowledge: Good  Language: Good   Psychomotor Activity  Psychomotor Activity: Psychomotor Activity: Normal   Assets  Assets: Housing; Social Support   Sleep  Sleep: Sleep: Fair Number of Hours of Sleep: 6   Nutritional Assessment (For OBS and FBC admissions only) Has the patient had a weight loss or gain of 10 pounds or more in the last 3 months?: No Has the patient had a decrease in  food intake/or appetite?: No Does the patient have dental problems?: No Does the patient have eating habits or behaviors that may be indicators of an eating disorder including binging or inducing vomiting?: No Has the patient recently lost weight without trying?: 0 Has the patient been eating poorly because of a decreased appetite?: 0 Malnutrition Screening Tool Score: 0    Physical Exam  Physical Exam HENT:     Head: Normocephalic and atraumatic.     Mouth/Throat:     Mouth: Mucous membranes are moist.  Eyes:     Extraocular Movements: Extraocular movements intact.  Pulmonary:     Effort: Pulmonary effort is normal.  Musculoskeletal:        General: Normal range of motion.     Cervical back: Normal range of motion.  Skin:    General: Skin is warm and dry.  Neurological:     General: No focal deficit present.     Mental Status: He is alert.  Psychiatric:        Mood and Affect: Affect is labile.        Behavior: Behavior is agitated.        Thought Content: Thought content is paranoid and delusional.        Judgment: Judgment is impulsive.    Review of Systems  Constitutional: Negative.   HENT: Negative.    Eyes: Negative.   Respiratory: Negative.    Cardiovascular: Negative.   Gastrointestinal: Negative.   Genitourinary: Negative.   Musculoskeletal: Negative.   Skin: Negative.   Neurological: Negative.   Endo/Heme/Allergies: Negative.    Blood pressure 116/70, pulse 84, temperature 98.3 F (36.8 C), temperature source Oral, resp. rate 18, SpO2 100 %. There is no height or weight on file to calculate BMI.  Treatment Plan Summary: Daily contact with patient to assess and evaluate symptoms and progress in treatment and Medication management  Shawn Berger is a 32 yo male with past psychiatric history of Asperger's, methamphetamine-induced psychosis, methamphetamine use, oxazepam use, cannabis use who presents with continued delusions, inability to care for  himself, and ongoing substance use, suspect substance-induced psychosis at this time, likely exacerbated by his Asperger's. He has a past medical history of HIV and is taking Biktarvy. He was recently seen by ID clinic. He was IVC'ed by his father and this was upheld by this provider. First exam was completed today. Patient denies substance use which is inconsistent with his UDS+MJ, meth, oxazepam, amphetamine. He was previously prescribed seroquel 150 bid during  prior psychiatric hospitalization 4 years ago. Will restart this medication and see if patient is willing to take it. Recommending inpatient psychiatric hospitalization for this patient. There are no beds available at [facility], patient has been faxed out.   #Substance induced psychosis  #Methamphetamine, oxazepam, cannabis use  -restart seroquel 100mg  BID  -agitation protocol in place (haldol, ativan, cogentin)  #HIV -continue Biktarvy daily   Disposition: inpatient psychiatric hospital  Karie Fetch, MD 03/24/2023 1:12 PM

## 2023-03-24 NOTE — ED Notes (Signed)
Patient A&Ox4. Denies intent to harm self/others when asked. Denies A/VH. Patient denies any physical complaints when asked. No acute distress noted. Pleasant with staff. Pt provided urine sample upon request. Results given to provider. Support and encouragement provided. Routine safety checks conducted according to facility protocol. Encouraged patient to notify staff if thoughts of harm toward self or others arise. Patient verbalize understanding and agreement. Will continue to monitor for safety.

## 2023-03-25 ENCOUNTER — Other Ambulatory Visit: Payer: Self-pay

## 2023-03-25 DIAGNOSIS — R45851 Suicidal ideations: Secondary | ICD-10-CM | POA: Diagnosis not present

## 2023-03-25 NOTE — ED Notes (Signed)
Patient offered lunch. Remains cooperative and safe on unit. No s/s of current distress.

## 2023-03-25 NOTE — ED Notes (Signed)
Patient is resting comfortably. Prefer to not disturb patient gets very irritated and mad letting patient rest at the moment will try and get vitals later on once the patient is awake.

## 2023-03-25 NOTE — ED Notes (Signed)
Pt sleeping@this time. Breathing even and unlabored will continue to monitor for safety 

## 2023-03-25 NOTE — ED Notes (Signed)
Pt sleeping at present, respirations even & unlabored, skin color good.  Monitoring for safety. °

## 2023-03-25 NOTE — ED Notes (Signed)
Patient appears irritable stating he doesn't need to be here. Patient refused seroquel this morning because "I dont need it." Patient provided with breakfast. Patient denies SI,HI, and A/V/H. No s/s of current distress.

## 2023-03-25 NOTE — ED Notes (Signed)
Patient observed sleeping. Breathing even and unlabored. No s/s of current distress. 

## 2023-03-25 NOTE — ED Notes (Signed)
Patient observed resting mostly throughout day. No s/s of current distress.

## 2023-03-25 NOTE — ED Notes (Signed)
Pt provided with breakfast; yogurt, oatmeal and juice.

## 2023-03-25 NOTE — ED Provider Notes (Signed)
Behavioral Health Progress Note  Date and Time: 03/25/2023 4:33 PM Name: Shawn Berger MRN:  [id number]  Subjective:  "I feel fine"  Shawn Berger is a 32 year-old male admitted involuntarily  on 03/23/2021 secondary to increased auditory hallucinations, suicidal ideations, anger and aggressive behaviors. He presented with a hx of Asperger's, polysubstance use and related psychosis, along with HIV and has had multiple hospitalizations in the past. Per chart review, patient was experiencing hallucinations and paranoid thoughts with disorganized thinking. He admitted that he has been hearing voices for the past 6 months. He was irritable and verbally aggressive and was denying substance use though his UDS tested positive for amphetamines, Oxazepam, Meth, and Marijuana. His dad reported that patient has episodes of paranoia, that patient had trashed his apartment looking for "Everlean Cherry was after him. Patient believed that this person was hiding inside the mattress.  Patient's father reported that patient might be influenced by  illegal drugs; he reported that things have been going downhill ever since moved out of his father's house to live in his own apartment.  Patient had been experiencing self-care deficit evidenced by inability to keep his apartment clean. Patient had destroyed his apartment  and his paranoia was increasing. Patient's last hospitalization was in 2020 in [facility] where he was diagnosed with Methamphetamine induced psychosis. He was discharged on  Seroquel 150 mg PO BID for 30 days.  There is no information indicating if patient has been receiving any outpatient services.   Assessments: Patient in bed awake. Receptive  upon approach and has calmed down. He is alert and oriented x 4. He appears disheveled with poor hygiene.  He is guarded and denying having any mental health problem. He is minimizing his need to be here and requesting to be discharged. Patient states "I feel  fine". Patient denies substance use which  is contrary to his UDS results. Patient refuses to elaborate on his mental health condition. He denies having medical problem and states "I don't know why I am here, I am fine, I need to go". No aggressive behaviors noted.  Patient decides not to talk about his life and and decides to remain silent.   I attempted to contact patient's father for collateral information but he was not available contact number on file.  Patient was informed that he will need to be reevaluated in AM  and  will know the next step. I contacted the [facility] and was informed that there are no available beds. SW was contacted for referrals.  We will continue to monitor and provide support. Patient will continue his current medication regimen.      Diagnosis:  Final diagnoses:  Paranoid behavior (HCC)  Aggression aggravated  Suicidal ideation  Psychoactive substance-induced psychosis (HCC)    Total Time spent with patient: 30 minutes  Past Psychiatric History: Polysubstance abuse, ADHD, Asperger's, GAD,  Past Medical History: HIV, Syphilis,   Family History: NA Family Psychiatric  History: NA Social History: Patient lives alone in his apartment. Unemployed. Father is primary support  Additional Social History:    Pain Medications: see MAR Prescriptions: see MAR Over the Counter: see MAR History of alcohol / drug use?: No history of alcohol / drug abuse Longest period of sobriety (when/how long): n/a Negative Consequences of Use:  (n/a) Withdrawal Symptoms:  (n/a)                    Sleep: Fair  Appetite:  Fair  Current  Medications:  Current Facility-Administered Medications  Medication Dose Route Frequency Provider Last Rate Last Admin   acetaminophen (TYLENOL) tablet 650 mg  650 mg Oral Q6H PRN Sindy Guadeloupe, NP       alum & mag hydroxide-simeth (MAALOX/MYLANTA) 200-200-20 MG/5ML suspension 30 mL  30 mL Oral Q4H PRN Sindy Guadeloupe, NP        haloperidol (HALDOL) tablet 5 mg  5 mg Oral Q4H PRN Karie Fetch, MD       And   LORazepam (ATIVAN) tablet 2 mg  2 mg Oral Q4H PRN Karie Fetch, MD       And   benztropine (COGENTIN) tablet 1 mg  1 mg Oral Q4H PRN Karie Fetch, MD       haloperidol lactate (HALDOL) injection 5 mg  5 mg Intramuscular Q4H PRN Karie Fetch, MD   5 mg at 03/24/23 2103   And   LORazepam (ATIVAN) injection 2 mg  2 mg Intramuscular Q4H PRN Karie Fetch, MD   2 mg at 03/24/23 2104   And   benztropine mesylate (COGENTIN) injection 1 mg  1 mg Intramuscular Q4H PRN Karie Fetch, MD   1 mg at 03/24/23 2104   bictegravir-emtricitabine-tenofovir AF (BIKTARVY) 50-200-25 MG per tablet 1 tablet  1 tablet Oral Daily Sindy Guadeloupe, NP   1 tablet at 03/25/23 1610   magnesium hydroxide (MILK OF MAGNESIA) suspension 30 mL  30 mL Oral Daily PRN Sindy Guadeloupe, NP       QUEtiapine (SEROQUEL) tablet 100 mg  100 mg Oral BID Karie Fetch, MD       Current Outpatient Medications  Medication Sig Dispense Refill   bictegravir-emtricitabine-tenofovir AF (BIKTARVY) 50-200-25 MG TABS tablet Take 1 tablet by mouth daily.      Labs  Lab Results:  Admission on 03/23/2023  Component Date Value Ref Range Status   WBC 03/23/2023 5.2  4.0 - 10.5 K/uL Final   RBC 03/23/2023 4.94  4.22 - 5.81 MIL/uL Final   Hemoglobin 03/23/2023 14.5  13.0 - 17.0 g/dL Final   HCT 96/01/5408 42.1  39.0 - 52.0 % Final   MCV 03/23/2023 85.2  80.0 - 100.0 fL Final   MCH 03/23/2023 29.4  26.0 - 34.0 pg Final   MCHC 03/23/2023 34.4  30.0 - 36.0 g/dL Final   RDW 81/19/1478 12.8  11.5 - 15.5 % Final   Platelets 03/23/2023 244  150 - 400 K/uL Final   nRBC 03/23/2023 0.0  0.0 - 0.2 % Final   Neutrophils Relative % 03/23/2023 59  % Final   Neutro Abs 03/23/2023 3.0  1.7 - 7.7 K/uL Final   Lymphocytes Relative 03/23/2023 28  % Final   Lymphs Abs 03/23/2023 1.5  0.7 - 4.0 K/uL Final   Monocytes Relative 03/23/2023 8  % Final   Monocytes  Absolute 03/23/2023 0.4  0.1 - 1.0 K/uL Final   Eosinophils Relative 03/23/2023 5  % Final   Eosinophils Absolute 03/23/2023 0.3  0.0 - 0.5 K/uL Final   Basophils Relative 03/23/2023 0  % Final   Basophils Absolute 03/23/2023 0.0  0.0 - 0.1 K/uL Final   Immature Granulocytes 03/23/2023 0  % Final   Abs Immature Granulocytes 03/23/2023 0.01  0.00 - 0.07 K/uL Final   Performed at [facility], [address] [place], [place] [address]   Sodium 03/23/2023 135  135 - 145 mmol/L Final   Potassium 03/23/2023 4.4  3.5 - 5.1 mmol/L Final   Chloride 03/23/2023 102  98 -  111 mmol/L Final   CO2 03/23/2023 25  22 - 32 mmol/L Final   Glucose, Bld 03/23/2023 100 (H)  70 - 99 mg/dL Final   Glucose reference range applies only to samples taken after fasting for at least 8 hours.   BUN 03/23/2023 10  6 - 20 mg/dL Final   Creatinine, Ser 03/23/2023 1.30 (H)  0.61 - 1.24 mg/dL Final   Calcium 16/07/9603 9.5  8.9 - 10.3 mg/dL Final   Total Protein 54/06/8118 6.5  6.5 - 8.1 g/dL Final   Albumin 14/78/2956 4.2  3.5 - 5.0 g/dL Final   AST 21/30/8657 25  15 - 41 U/L Final   ALT 03/23/2023 24  0 - 44 U/L Final   Alkaline Phosphatase 03/23/2023 81  38 - 126 U/L Final   Total Bilirubin 03/23/2023 1.0  0.3 - 1.2 mg/dL Final   GFR, Estimated 03/23/2023 >60  >60 mL/min Final   Comment: (NOTE) Calculated using the CKD-EPI Creatinine Equation (2021)    Anion gap 03/23/2023 8  5 - 15 Final   Performed at [facility], [address], [place], [place] [address]   Hgb A1c MFr Bld 03/23/2023 4.9  4.8 - 5.6 % Final   Comment: (NOTE) Pre diabetes:          5.7%-6.4%  Diabetes:              >6.4%  Glycemic control for   <7.0% adults with diabetes    Mean Plasma Glucose 03/23/2023 93.93  mg/dL Final   Performed at [facility], [address] [place], [place] [address]   Alcohol, Ethyl (B) 03/23/2023 <10  <10 mg/dL Final   Comment: (NOTE) Lowest detectable limit for serum alcohol is 10  mg/dL.  For medical purposes only. Performed at [facility], [address] [place], [place] [address]    Cholesterol 03/23/2023 109  0 - 200 mg/dL Final   Triglycerides 40/07/2724 43  <150 mg/dL Final   HDL 36/64/4034 51  >40 mg/dL Final   Total CHOL/HDL Ratio 03/23/2023 2.1  RATIO Final   VLDL 03/23/2023 9  0 - 40 mg/dL Final   LDL Cholesterol 03/23/2023 49  0 - 99 mg/dL Final   Comment:        Total Cholesterol/HDL:CHD Risk Coronary Heart Disease Risk Table                     Men   Women  1/2 Average Risk   3.4   3.3  Average Risk       5.0   4.4  2 X Average Risk   9.6   7.1  3 X Average Risk  23.4   11.0        Use the calculated Patient Ratio above and the CHD Risk Table to determine the patient's CHD Risk.        ATP III CLASSIFICATION (LDL):  <100     mg/dL   Optimal  742-595  mg/dL   Near or Above                    Optimal  130-159  mg/dL   Borderline  638-756  mg/dL   High  >433     mg/dL   Very High Performed at [facility], [address], [place], [place] [address]    POC Amphetamine UR 03/24/2023 Positive (A)  NONE DETECTED (Cut Off Level 1000 ng/mL) Final  POC Secobarbital (BAR) 03/24/2023 None Detected  NONE DETECTED (Cut Off Level 300 ng/mL) Final   POC Buprenorphine (BUP) 03/24/2023 None Detected  NONE DETECTED (Cut Off Level 10 ng/mL) Final   POC Oxazepam (BZO) 03/24/2023 Positive (A)  NONE DETECTED (Cut Off Level 300 ng/mL) Final   POC Cocaine UR 03/24/2023 None Detected  NONE DETECTED (Cut Off Level 300 ng/mL) Final   POC Methamphetamine UR 03/24/2023 Positive (A)  NONE DETECTED (Cut Off Level 1000 ng/mL) Final   POC Morphine 03/24/2023 None Detected  NONE DETECTED (Cut Off Level 300 ng/mL) Final   POC Methadone UR 03/24/2023 None Detected  NONE DETECTED (Cut Off Level 300 ng/mL) Final   POC Oxycodone UR 03/24/2023 None Detected  NONE DETECTED (Cut Off Level 100 ng/mL) Final   POC Marijuana UR 03/24/2023 Positive (A)  NONE DETECTED  (Cut Off Level 50 ng/mL) Final   TSH 03/23/2023 1.589  0.350 - 4.500 uIU/mL Final   Comment: Performed by a 3rd Generation assay with a functional sensitivity of <=0.01 uIU/mL. Performed at [facility], [address], [place], [place] [address]     Blood Alcohol level:  Lab Results  Component Value Date   ETH <10 03/23/2023    Metabolic Disorder Labs: Lab Results  Component Value Date   HGBA1C 4.9 03/23/2023   MPG 93.93 03/23/2023   No results found for: "PROLACTIN" Lab Results  Component Value Date   CHOL 109 03/23/2023   TRIG 43 03/23/2023   HDL 51 03/23/2023   CHOLHDL 2.1 03/23/2023   VLDL 9 03/23/2023   LDLCALC 49 03/23/2023    Therapeutic Lab Levels: No results found for: "LITHIUM" No results found for: "VALPROATE" No results found for: "CBMZ"  Physical Findings   GAD-7    Flowsheet Row [facility]/2020 [facility] Office Visit from 04/15/2020 in [facility] Office Visit from 06/07/2018 in [facility]  Total GAD-7 Score 5 12 17       PHQ2-9    Flowsheet Row ED from 03/23/2023 in [facility] Office Visit from 09/11/2020 in [facility] Office Visit from 07[facility]31/2020 [facility] Visit from 05/18/2020 [facility] Visit from 04/15/2020 [facility]  PHQ-2 Total Score 0 2 2 0 2  PHQ-9 Total Score -- 8 4 0 15      Flowsheet Row ED from 03/23/2023 in [facility]  C-SSRS RISK CATEGORY No Risk        Musculoskeletal  Strength & Muscle Tone: within normal limits Gait & Station: normal Patient leans: N/A  Psychiatric Specialty Exam  Presentation  General Appearance:  Disheveled; Bizarre  Eye Contact: Fair  Speech: Garbled  Speech Volume: Normal  Handedness: Right   Mood and Affect   Mood: Anxious  Affect: Congruent   Thought Process  Thought Processes: Coherent  Descriptions of Associations:Intact  Orientation:Full (Time, Place and Person)  Thought Content:Paranoid Ideation  Diagnosis of Schizophrenia or Schizoaffective disorder in past: No    Hallucinations:Hallucinations: None Description of Auditory Hallucinations: hearing voice of a man Yetta Glassman) talking to him Description of Visual Hallucinations: seeing shadows around the home  Ideas of Reference:Paranoia; Delusions  Suicidal Thoughts:Suicidal Thoughts: Yes, Active  Homicidal Thoughts:Homicidal Thoughts: No   Sensorium  Memory: Immediate Fair; Recent Fair; Remote Fair  Judgment: Poor  Insight: Poor   Executive Functions  Concentration: Fair  Attention  Span: Fair  Recall: [employer] of Knowledge: Fair  Language: Fair   Psychomotor Activity  Psychomotor Activity: Psychomotor Activity: Normal   Assets  Assets: Manufacturing systems engineer; Social Support   Sleep  Sleep: Sleep: Fair Number of Hours of Sleep: 6   Nutritional Assessment (For OBS and FBC admissions only) Has the patient had a weight loss or gain of 10 pounds or more in the last 3 months?: No Has the patient had a decrease in food intake/or appetite?: No Does the patient have eating habits or behaviors that may be indicators of an eating disorder including binging or inducing vomiting?: No Has the patient recently lost weight without trying?: 0 Has the patient been eating poorly because of a decreased appetite?: 0 Malnutrition Screening Tool Score: 0    Physical Exam  Physical Exam Review of Systems  Constitutional: Negative.   HENT: Negative.    Eyes: Negative.   Respiratory: Negative.    Cardiovascular: Negative.   Gastrointestinal: Negative.   Genitourinary: Negative.   Musculoskeletal: Negative.   Skin: Negative.   Neurological: Negative.   Endo/Heme/Allergies: Negative.    Psychiatric/Behavioral:  Positive for substance abuse. The patient is nervous/anxious.    Blood pressure 120/74, pulse 90, temperature 97.7 F (36.5 C), temperature source Oral, resp. rate 18, SpO2 100 %. There is no height or weight on file to calculate BMI.  Treatment Plan Summary: Daily contact with patient to assess and evaluate symptoms and progress in treatment, Medication management, and awaits for disposition.   Olin Pia, NP 03/25/2023 4:33 PM

## 2023-03-25 NOTE — ED Notes (Signed)
Pt sleeping at present, no distress noted.  Monitoring for safety. 

## 2023-03-26 DIAGNOSIS — R45851 Suicidal ideations: Secondary | ICD-10-CM | POA: Diagnosis not present

## 2023-03-26 NOTE — ED Notes (Signed)
Patient was offered a meal for lunch, but patient declined

## 2023-03-26 NOTE — ED Notes (Signed)
Pt observed/assessed in recliner sleeping. RR even and unlabored, appearing in no noted distress. Environmental check complete, will continue to monitor for safety 

## 2023-03-26 NOTE — Progress Notes (Signed)
[facility] LCSW Progress Note   03/26/2023    3:46 PM  Shawn Berger   [id number]   Type of Contact and Topic:  Psychiatric Bed Placement   Pt accepted to [name]    Patient meets inpatient criteria per Darrick Grinder, NP  The attending provider will be Dr. Alan Ripper  Call report to [phone]  Roseanne Reno, RN @ [name] notified.     Pt scheduled  to arrive at [name] after 0900.    Damita Dunnings, MSW, LCSW-A  3:50 PM 03/26/2023
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
## 2023-03-26 NOTE — Progress Notes (Signed)
Patient has been denied by [facility] due to no appropriate beds available. Patient meets BH inpatient criteria per Olin Pia, NP. Patient has been faxed out to the following facilities:   [facility]  [address], [place] [address] [phone]  [facility]  [address] [place], [place] [address] [phone]  [facility] [address] [place]  [address], [place] [address] [phone]  [facility]  [address] [place], [place] [address] [phone]  [facility]  [address] [place] [address] [phone]  [facility]  [address] [place] [address] [phone]  [facility]  [address] [place] [address] [phone]  [facility]  [address] [place], [place] [address] [phone]  [facility]  [address] [place] [address] [phone]  [facility]  [address] [place] [address] [phone]  [facility]  [address] [place] [address] [phone]  [facility]  [address] [place] [address] [phone]  [facility]  [address], [place] [address] [phone]  [facility]  [address], [place] [address] [phone]  [facility]  [address] [place] [address] [phone]  [facility]  [address], [place] [address] [phone]  [facility]  [address] [place] [address] [phone]  [facility]  [address], [place] [address] [phone]  [facility]  [address], [place] [address] [phone]  [facility]  [address] [place], [place] [address] [phone]  [facility]  [address] [place] [address] [phone]  [facility]  [address] [place] [address] [phone]  [facility]  [address] [place] [address] [phone]  [facility]  [address] [place] [address] [phone]  [facility]  [address], [place] [address] [phone]   Damita Dunnings, MSW, LCSW-A  10:25 AM 03/26/2023

## 2023-03-26 NOTE — ED Notes (Signed)
Pt sleeping@this time. Breathing even and unlabored. Will continue to monitor for safety 

## 2023-03-26 NOTE — ED Notes (Signed)
Patient was provided with a hot meal & juice for dinner

## 2023-03-26 NOTE — ED Provider Notes (Signed)
Behavioral Health Progress Note  Date and Time: 03/26/2023 10:52 AM Name: Shawn Berger MRN:  [id number]  Subjective:    Patient reports he was brought in by the police "because people think I'm crazy. I've had somebody messing with me for the past few months". Patient reports he met someone online 4 years ago and this individual has been staying in the shadows so he cannot see him but he can hear him. Reports this individual last spoke with him on Thursday. He states this individual says degrading things to home such as "you look stupid". He denies this individual tells him to hurt himself or anyone else. He reports he does not know why this individual is messing with him.  He denies suicidal, homicidal ideations. He denies this individual is speaking with him currently, or he is experiencing auditory hallucinations. He denies visual hallucinations.  Reviewed with patient he is current under involuntary commitment and is being recommended for inpatient psychiatric admission.   Diagnosis:  Final diagnoses:  Paranoid behavior (HCC)  Aggression aggravated  Suicidal ideation  Psychoactive substance-induced psychosis (HCC)    Total Time spent with patient: 45 minutes  Past Psychiatric History: Asperger, methamphetamine induced psychosis Past Medical History: HIV, history of syphilis treated by health department in 11/2009 Family History: None reported Family Psychiatric  History: None reported Social History: lives alone in apartment, unemployed, previously worked for [employer] for 3 months  Additional Social History:    Pain Medications: see MAR Prescriptions: see MAR Over the Counter: see MAR History of alcohol / drug use?: No history of alcohol / drug abuse Longest period of sobriety (when/how long): n/a Negative Consequences of Use:  (n/a) Withdrawal Symptoms:  (n/a)                    Sleep: Fair  Appetite:  Fair  Current Medications:  Current  Facility-Administered Medications  Medication Dose Route Frequency Provider Last Rate Last Admin   acetaminophen (TYLENOL) tablet 650 mg  650 mg Oral Q6H PRN Sindy Guadeloupe, NP       alum & mag hydroxide-simeth (MAALOX/MYLANTA) 200-200-20 MG/5ML suspension 30 mL  30 mL Oral Q4H PRN Sindy Guadeloupe, NP       haloperidol (HALDOL) tablet 5 mg  5 mg Oral Q4H PRN Karie Fetch, MD       And   LORazepam (ATIVAN) tablet 2 mg  2 mg Oral Q4H PRN Karie Fetch, MD       And   benztropine (COGENTIN) tablet 1 mg  1 mg Oral Q4H PRN Karie Fetch, MD       haloperidol lactate (HALDOL) injection 5 mg  5 mg Intramuscular Q4H PRN Karie Fetch, MD   5 mg at 03/24/23 2103   And   LORazepam (ATIVAN) injection 2 mg  2 mg Intramuscular Q4H PRN Karie Fetch, MD   2 mg at 03/24/23 2104   And   benztropine mesylate (COGENTIN) injection 1 mg  1 mg Intramuscular Q4H PRN Karie Fetch, MD   1 mg at 03/24/23 2104   bictegravir-emtricitabine-tenofovir AF (BIKTARVY) 50-200-25 MG per tablet 1 tablet  1 tablet Oral Daily Sindy Guadeloupe, NP   1 tablet at 03/25/23 7253   magnesium hydroxide (MILK OF MAGNESIA) suspension 30 mL  30 mL Oral Daily PRN Sindy Guadeloupe, NP       QUEtiapine (SEROQUEL) tablet 100 mg  100 mg Oral BID Karie Fetch, MD       Current Outpatient Medications  Medication Sig  Dispense Refill   bictegravir-emtricitabine-tenofovir AF (BIKTARVY) 50-200-25 MG TABS tablet Take 1 tablet by mouth daily.      Labs  Lab Results:  Admission on 03/23/2023  Component Date Value Ref Range Status   WBC 03/23/2023 5.2  4.0 - 10.5 K/uL Final   RBC 03/23/2023 4.94  4.22 - 5.81 MIL/uL Final   Hemoglobin 03/23/2023 14.5  13.0 - 17.0 g/dL Final   HCT 16/07/9603 42.1  39.0 - 52.0 % Final   MCV 03/23/2023 85.2  80.0 - 100.0 fL Final   MCH 03/23/2023 29.4  26.0 - 34.0 pg Final   MCHC 03/23/2023 34.4  30.0 - 36.0 g/dL Final   RDW 54/06/8118 12.8  11.5 - 15.5 % Final   Platelets 03/23/2023 244  150 - 400  K/uL Final   nRBC 03/23/2023 0.0  0.0 - 0.2 % Final   Neutrophils Relative % 03/23/2023 59  % Final   Neutro Abs 03/23/2023 3.0  1.7 - 7.7 K/uL Final   Lymphocytes Relative 03/23/2023 28  % Final   Lymphs Abs 03/23/2023 1.5  0.7 - 4.0 K/uL Final   Monocytes Relative 03/23/2023 8  % Final   Monocytes Absolute 03/23/2023 0.4  0.1 - 1.0 K/uL Final   Eosinophils Relative 03/23/2023 5  % Final   Eosinophils Absolute 03/23/2023 0.3  0.0 - 0.5 K/uL Final   Basophils Relative 03/23/2023 0  % Final   Basophils Absolute 03/23/2023 0.0  0.0 - 0.1 K/uL Final   Immature Granulocytes 03/23/2023 0  % Final   Abs Immature Granulocytes 03/23/2023 0.01  0.00 - 0.07 K/uL Final   Performed at [facility], [address], [place], [place] [address]   Sodium 03/23/2023 135  135 - 145 mmol/L Final   Potassium 03/23/2023 4.4  3.5 - 5.1 mmol/L Final   Chloride 03/23/2023 102  98 - 111 mmol/L Final   CO2 03/23/2023 25  22 - 32 mmol/L Final   Glucose, Bld 03/23/2023 100 (H)  70 - 99 mg/dL Final   Glucose reference range applies only to samples taken after fasting for at least 8 hours.   BUN 03/23/2023 10  6 - 20 mg/dL Final   Creatinine, Ser 03/23/2023 1.30 (H)  0.61 - 1.24 mg/dL Final   Calcium 95/62/1308 9.5  8.9 - 10.3 mg/dL Final   Total Protein 65/78/4696 6.5  6.5 - 8.1 g/dL Final   Albumin 29/52/8413 4.2  3.5 - 5.0 g/dL Final   AST 24/40/1027 25  15 - 41 U/L Final   ALT 03/23/2023 24  0 - 44 U/L Final   Alkaline Phosphatase 03/23/2023 81  38 - 126 U/L Final   Total Bilirubin 03/23/2023 1.0  0.3 - 1.2 mg/dL Final   GFR, Estimated 03/23/2023 >60  >60 mL/min Final   Comment: (NOTE) Calculated using the CKD-EPI Creatinine Equation (2021)    Anion gap 03/23/2023 8  5 - 15 Final   Performed at [facility], [address], [place], [place] [address]   Hgb A1c MFr Bld 03/23/2023 4.9  4.8 - 5.6 % Final   Comment: (NOTE) Pre diabetes:          5.7%-6.4%  Diabetes:               >6.4%  Glycemic control for   <7.0% adults with diabetes    Mean Plasma Glucose 03/23/2023 93.93  mg/dL Final   Performed at [facility], [address] [place], [place] [address]  Alcohol, Ethyl (B) 03/23/2023 <10  <10 mg/dL Final   Comment: (NOTE) Lowest detectable limit for serum alcohol is 10 mg/dL.  For medical purposes only. Performed at [facility], [address], [place], [place] [address]    Cholesterol 03/23/2023 109  0 - 200 mg/dL Final   Triglycerides 60/45/4098 43  <150 mg/dL Final   HDL 11/91/4782 51  >40 mg/dL Final   Total CHOL/HDL Ratio 03/23/2023 2.1  RATIO Final   VLDL 03/23/2023 9  0 - 40 mg/dL Final   LDL Cholesterol 03/23/2023 49  0 - 99 mg/dL Final   Comment:        Total Cholesterol/HDL:CHD Risk Coronary Heart Disease Risk Table                     Men   Women  1/2 Average Risk   3.4   3.3  Average Risk       5.0   4.4  2 X Average Risk   9.6   7.1  3 X Average Risk  23.4   11.0        Use the calculated Patient Ratio above and the CHD Risk Table to determine the patient's CHD Risk.        ATP III CLASSIFICATION (LDL):  <100     mg/dL   Optimal  956-213  mg/dL   Near or Above                    Optimal  130-159  mg/dL   Borderline  086-578  mg/dL   High  >469     mg/dL   Very High Performed at [facility], [address], [place], [place] [address]    POC Amphetamine UR 03/24/2023 Positive (A)  NONE DETECTED (Cut Off Level 1000 ng/mL) Final   POC Secobarbital (BAR) 03/24/2023 None Detected  NONE DETECTED (Cut Off Level 300 ng/mL) Final   POC Buprenorphine (BUP) 03/24/2023 None Detected  NONE DETECTED (Cut Off Level 10 ng/mL) Final   POC Oxazepam (BZO) 03/24/2023 Positive (A)  NONE DETECTED (Cut Off Level 300 ng/mL) Final   POC Cocaine UR 03/24/2023 None Detected  NONE DETECTED (Cut Off Level 300 ng/mL) Final   POC Methamphetamine UR 03/24/2023 Positive (A)  NONE DETECTED (Cut Off Level 1000 ng/mL) Final   POC Morphine  03/24/2023 None Detected  NONE DETECTED (Cut Off Level 300 ng/mL) Final   POC Methadone UR 03/24/2023 None Detected  NONE DETECTED (Cut Off Level 300 ng/mL) Final   POC Oxycodone UR 03/24/2023 None Detected  NONE DETECTED (Cut Off Level 100 ng/mL) Final   POC Marijuana UR 03/24/2023 Positive (A)  NONE DETECTED (Cut Off Level 50 ng/mL) Final   TSH 03/23/2023 1.589  0.350 - 4.500 uIU/mL Final   Comment: Performed by a 3rd Generation assay with a functional sensitivity of <=0.01 uIU/mL. Performed at [facility], [address], [place], [place] [address]     Blood Alcohol level:  Lab Results  Component Value Date   ETH <10 03/23/2023    Metabolic Disorder Labs: Lab Results  Component Value Date   HGBA1C 4.9 03/23/2023   MPG 93.93 03/23/2023   No results found for: "PROLACTIN" Lab Results  Component Value Date   CHOL 109 03/23/2023   TRIG 43 03/23/2023   HDL 51 03/23/2023   CHOLHDL 2.1 03/23/2023   VLDL 9 03/23/2023   LDLCALC 49 03/23/2023    Therapeutic  Lab Levels: No results found for: "LITHIUM" No results found for: "VALPROATE" No results found for: "CBMZ"  Physical Findings   GAD-7    Flowsheet Row Office Visit [facility] 07/31/2020 in  [facility] from 04/15/2020 in [facility] [facility] Visit from 06/07/2018 in [facility]  Total GAD-7 Score 5 12 17       PHQ2-9    Flowsheet Row ED from 03/23/2023 in [facility] Office Visit from 09/11/2020 in [facility] Office Visit from 07/31/2020 in [place] [facility] Visit from 05/18/2020 in [place] [facility] from 04/15/2020 in [facility]  PHQ-2 Total Score 0 2 2 0 2  PHQ-9 Total Score -- 8 4 0 15      Flowsheet Row ED from 03/23/2023 in [facility]  C-SSRS RISK CATEGORY No Risk         Musculoskeletal  Strength & Muscle Tone: within normal limits Gait & Station: normal Patient leans: N/A  Psychiatric Specialty Exam  Presentation  General Appearance:  Disheveled  Eye Contact: Fair  Speech: Garbled  Speech Volume: Normal  Handedness: Right  Mood and Affect  Mood: Anxious  Affect: Appropriate  Thought Process  Thought Processes: Coherent  Descriptions of Associations:Intact  Orientation:Full (Time, Place and Person)  Thought Content:Paranoid Ideation; Delusions  Diagnosis of Schizophrenia or Schizoaffective disorder in past: No    Hallucinations:Hallucinations: None  Ideas of Reference:Paranoia; Delusions  Suicidal Thoughts:Suicidal Thoughts: No  Homicidal Thoughts:Homicidal Thoughts: No   Sensorium  Memory: Immediate Fair  Judgment: Poor  Insight: Poor   Executive Functions  Concentration: Fair  Attention Span: Fair  Recall: Fair  Fund of Knowledge: Fair  Language: Fair   Psychomotor Activity  Psychomotor Activity: Psychomotor Activity: Normal   Assets  Assets: Communication Skills; Desire for Improvement; Financial Resources/Insurance; Housing; Resilience   Sleep  Sleep: Sleep: Fair Number of Hours of Sleep: 6   Nutritional Assessment (For OBS and FBC admissions only) Has the patient had a weight loss or gain of 10 pounds or more in the last 3 months?: No Has the patient had a decrease in food intake/or appetite?: No Does the patient have eating habits or behaviors that may be indicators of an eating disorder including binging or inducing vomiting?: No Has the patient recently lost weight without trying?: 0 Has the patient been eating poorly because of a decreased appetite?: 0 Malnutrition Screening Tool Score: 0    Physical Exam  Physical Exam Constitutional:      General: He is not in acute distress.    Appearance: He is not ill-appearing, toxic-appearing or diaphoretic.  Eyes:      General: No scleral icterus. Cardiovascular:     Rate and Rhythm: Normal rate.  Pulmonary:     Effort: Pulmonary effort is normal. No respiratory distress.  Neurological:     Mental Status: He is alert and oriented to person, place, and time.  Psychiatric:        Attention and Perception: Attention normal.        Mood and Affect: Mood is anxious.        Behavior: Behavior is combative. Behavior is cooperative.        Thought Content: Thought content is paranoid and delusional.    Review of Systems  Constitutional:  Negative for chills and fever.  Respiratory:  Negative for shortness of breath.   Cardiovascular:  Negative for chest  pain and palpitations.  Gastrointestinal:  Negative for abdominal pain.  Neurological:  Negative for headaches.  Psychiatric/Behavioral:  The patient is nervous/anxious.    Blood pressure (!) 100/59, pulse 84, temperature 98 F (36.7 C), temperature source Oral, resp. rate 16, SpO2 100 %. There is no height or weight on file to calculate BMI.  Treatment Plan Summary: Daily contact with patient to assess and evaluate symptoms and progress in treatment, Medication management, and Plan Recommend inpatient psychiatric admission  Lauree Chandler, NP 03/26/2023 10:52 AM

## 2023-03-26 NOTE — ED Notes (Signed)
Pt sleeping at present, no distress noted.  Monitoring for safety. 

## 2023-03-26 NOTE — ED Notes (Signed)
Patient was provided with oatmeal, muffin and juice

## 2023-03-26 NOTE — ED Notes (Signed)
A/O ,  denies SI/HI/AVH. Flat affect, seems guarded and provides vague answers to eval questions. No noted distress at moment. Will continue to monitor for safety.

## 2023-03-26 NOTE — ED Notes (Signed)
Patient continues to rest. Patient refused lunch. Will encourage to eat dinner. No s/s of current distress.

## 2023-03-27 DIAGNOSIS — R45851 Suicidal ideations: Secondary | ICD-10-CM | POA: Diagnosis not present

## 2023-03-27 MED ORDER — QUETIAPINE FUMARATE 100 MG PO TABS: 100.0000 mg | ORAL_TABLET | Freq: Two times a day (BID) | ORAL | 0 refills | Status: DC

## 2023-03-27 NOTE — ED Notes (Signed)
[facility] de[facility] called for transport to [facility]- left message and contact number

## 2023-03-27 NOTE — ED Notes (Signed)
Patient Is transferring to [facility] at this time via [facility]. Patient A&Ox4. Patient denies SI, HI,and A/V/H. Transfer paperwork provided to [facility]. Valuables/belongings sent with patient. No s/s of current distress.

## 2023-03-27 NOTE — ED Notes (Signed)
Pt observed/assessed in recliner sleeping. RR even and unlabored, appearing in no noted distress. Environmental check complete, will continue to monitor for safety 

## 2023-03-27 NOTE — ED Provider Notes (Signed)
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   Stay Summary: Per IVC petition on admission "Respondent has been diagnosed with aspergers sydrome and has been committed in the past. Respondent stated to his father that he should kill himself and that he keeps hearing voices. Respondent has extreme anger and has trashed the inside of his apartment. Respondent is believed to be under the influence of an unknown substance and has abused drugs in the past. Respondent has been very aggressive toward his father and he is afraid for the respondents safety while he is in this state. Without intervention the respondent could harm himself or others."   During admission, patient continued to report that he was being followed by an individual named Shawn Berger and reported that he was going to file a restraining order against him. He reported that this individual was saying demeaning things to him. He denied any command hallucinations. His UDS was +amphetamine, oxazepam, meth, MJ. He denied substance use despite UDS results. Collateral was obtained from dad Shawn Berger [phone]) who stated that patient has Asperger's and has had worsening paranoia since last September. He reports that patient had inability to care for himself and ongoing delusions. Patient's IVC was upheld by this provider and first exam was completed. Per chart review, patient was previously admitted to [facility] from 11/19-11/25/2020 and  diagnosed with methamphetamine-induced psychotic disorder and discharged on seroquel 150 BID for 30 days. He was restarted on seroquel 100 BID and refused medication until the evening prior to discharge.  He required PRN agitation medications on the evening of 6/20 and 6/21. He was minimally cooperative with providers during admission. He would intermittently report passive suicidal ideation. He continued to report that there was an individual messing with him and saying degrading things to him until the day prior to discharge. He denied any SI/HI at time of discharge. He denied AVH. He was recommended for inpatient psychiatric admission and was accepted to [facility]. He was discharged on seroquel 100 BID and biktarvy daily. He was provided with 3 day rx for [facility] and dad was told of patient's transfer to [facility] and would bring additional Biktarvy.   Total Time spent with patient: 30 minutes  Past Psychiatric History: Asperger's, methamphetamine-induced psychosis  Past Medical History: HIV, history of syphilis treated by health department in 11/2009  Family History: non-contributory  Family Psychiatric History: reports sister with depression  Social History: lives alone in apartment. Unemployed, previously worked for [employer] for 3 months.  Tobacco Cessation:  N/A, patient does not currently use tobacco products  Current Medications:  Current Facility-Administered Medications  Medication Dose Route Frequency Provider Last Rate Last Admin   acetaminophen (TYLENOL) tablet 650 mg  650 mg Oral Q6H PRN Sindy Guadeloupe, NP       alum & mag hydroxide-simeth (MAALOX/MYLANTA) 200-200-20 MG/5ML suspension 30 mL  30 mL Oral Q4H PRN Sindy Guadeloupe, NP       haloperidol (HALDOL) tablet 5 mg  5 mg Oral Q4H PRN Karie Fetch, MD  And   LORazepam (ATIVAN) tablet 2 mg  2 mg Oral Q4H PRN Karie Fetch, MD       And   benztropine (COGENTIN) tablet 1 mg  1 mg Oral Q4H PRN Karie Fetch, MD       haloperidol lactate (HALDOL) injection 5 mg  5 mg Intramuscular Q4H PRN Karie Fetch, MD   5 mg at 03/24/23 2103   And   LORazepam (ATIVAN) injection 2 mg  2 mg Intramuscular Q4H PRN Karie Fetch, MD   2 mg at 03/24/23 2104   And   benztropine mesylate (COGENTIN) injection 1 mg  1 mg Intramuscular Q4H PRN Karie Fetch, MD   1 mg at 03/24/23 2104   bictegravir-emtricitabine-tenofovir AF (BIKTARVY) 50-200-25 MG per tablet 1 tablet  1 tablet Oral Daily Sindy Guadeloupe, NP   1 tablet at 03/27/23 0815   magnesium hydroxide (MILK OF MAGNESIA) suspension 30 mL  30 mL Oral Daily PRN Sindy Guadeloupe, NP       QUEtiapine (SEROQUEL) tablet 100 mg  100 mg Oral BID Karie Fetch, MD   100 mg at 03/27/23 0815   Current Outpatient Medications  Medication Sig Dispense Refill   bictegravir-emtricitabine-tenofovir AF (BIKTARVY) 50-200-25 MG TABS tablet Take 1 tablet by mouth daily.     QUEtiapine (SEROQUEL) 100 MG tablet Take 1 tablet (100 mg total) by mouth 2 (two) times daily.  0    PTA Medications:  Facility Ordered Medications  Medication   acetaminophen (TYLENOL) tablet 650 mg   alum & mag hydroxide-simeth (MAALOX/MYLANTA) 200-200-20 MG/5ML suspension 30 mL   magnesium hydroxide (MILK OF MAGNESIA) suspension 30 mL   [COMPLETED] LORazepam (ATIVAN) tablet 2 mg   bictegravir-emtricitabine-tenofovir AF (BIKTARVY) 50-200-25 MG per tablet 1 tablet   haloperidol (HALDOL) tablet 5 mg   And   LORazepam (ATIVAN) tablet 2 mg   And   benztropine (COGENTIN) tablet 1 mg   haloperidol lactate (HALDOL) injection 5 mg   And   LORazepam (ATIVAN) injection 2 mg   And   benztropine mesylate (COGENTIN) injection 1 mg   QUEtiapine (SEROQUEL) tablet 100 mg   PTA Medications  Medication Sig   bictegravir-emtricitabine-tenofovir AF (BIKTARVY) 50-200-25 MG TABS tablet Take 1 tablet by mouth daily.   QUEtiapine (SEROQUEL) 100 MG tablet Take 1 tablet (100 mg total) by mouth 2 (two) times  daily.       03/25/2023    4:33 PM 09/11/2020    9:41 AM 07/31/2020    7:59 AM  Depression screen PHQ 2/9  Decreased Interest 0 0 0  Down, Depressed, Hopeless 0 2 2  PHQ - 2 Score 0 2 2  Altered sleeping  3 0  Tired, decreased energy  2 2  Change in appetite  0 0  Feeling bad or failure about yourself   1 0  Trouble concentrating  0 0  Moving slowly or fidgety/restless  0 0  Suicidal thoughts  0 0  PHQ-9 Score  8 4  Difficult doing work/chores  Not difficult at all     Flowsheet Row ED from 03/23/2023 in [facility]  C-SSRS RISK CATEGORY No Risk       Musculoskeletal  Strength & Muscle Tone: within normal limits Gait & Station: normal Patient leans: N/A  Psychiatric Specialty Exam  Presentation  General Appearance:  Disheveled  Eye Contact: Fair  Speech: Garbled  Speech Volume: Normal  Handedness: Right   Mood and Affect  Mood: Anxious; Irritable  Affect:  Appropriate   Thought Process  Thought Processes: Coherent  Descriptions of Associations:Intact  Orientation:Full (Time, Place and Person)  Thought Content:Paranoid Ideation  Diagnosis of Schizophrenia or Schizoaffective disorder in past: No    Hallucinations:Hallucinations: None  Ideas of Reference:Paranoia  Suicidal Thoughts:Suicidal Thoughts: No  Homicidal Thoughts:Homicidal Thoughts: No   Sensorium  Memory: Immediate Fair  Judgment: Poor  Insight: Poor   Executive Functions  Concentration: Fair  Attention Span: Fair  Recall: Fair  Fund of Knowledge: Fair  Language: Fair   Psychomotor Activity  Psychomotor Activity: Psychomotor Activity: Normal   Assets  Assets: Communication Skills; Financial Resources/Insurance; Housing; Social Support   Sleep  Sleep: Sleep: Fair   No data recorded  Physical Exam  Physical Exam Constitutional:      Appearance: Normal appearance.  HENT:     Head: Normocephalic and  atraumatic.     Mouth/Throat:     Mouth: Mucous membranes are moist.  Eyes:     Extraocular Movements: Extraocular movements intact.  Pulmonary:     Effort: Pulmonary effort is normal.  Musculoskeletal:        General: Normal range of motion.     Cervical back: Normal range of motion.  Skin:    General: Skin is warm and dry.  Neurological:     General: No focal deficit present.     Mental Status: He is alert.    Review of Systems  Constitutional: Negative.   HENT: Negative.    Eyes: Negative.   Respiratory: Negative.    Cardiovascular: Negative.   Gastrointestinal: Negative.   Genitourinary: Negative.   Musculoskeletal: Negative.   Skin: Negative.   Neurological: Negative.   Endo/Heme/Allergies: Negative.    Blood pressure 118/63, pulse 90, temperature 97.8 F (36.6 C), resp. rate 18, SpO2 100 %. There is no height or weight on file to calculate BMI.  Demographic Factors:  Male, Adolescent or young adult, Gay, lesbian, or bisexual orientation, and Unemployed  Loss Factors: Decrease in vocational status  Historical Factors: Impulsivity  Risk Reduction Factors:   Positive social support  Continued Clinical Symptoms:  Alcohol/Substance Abuse/Dependencies Currently Psychotic Previous Psychiatric Diagnoses and Treatments  Cognitive Features That Contribute To Risk:  Thought constriction (tunnel vision)    Suicide Risk:  Moderate: Frequent suicidal ideation with limited intensity, and duration, some specificity in terms of plans, no associated intent, good self-control, limited dysphoria/symptomatology, some risk factors present, and identifiable protective factors, including available and accessible social support.  Plan Of Care/Follow-up recommendations:  Follow-up recommendations:  Activity:  Normal, as tolerated Diet:  Per PCP recommendation  Patient is instructed prior to discharge to: Take all medications as prescribed by her mental healthcare  provider. Report any adverse effects and/or reactions from the medicines to her outpatient provider promptly. Patient has been instructed & cautioned: To not engage in alcohol and or illegal drug use while on prescription medicines.  In the event of worsening symptoms, patient is instructed to call the crisis hotline at 988, 911 and or go to the nearest ED for appropriate evaluation and treatment of symptoms. To follow-up with her primary care provider for your other medical issues, concerns and or health care needs.  Disposition: [facility] (IVC)  Karie Fetch, MD 03/27/2023, 8:41 AM

## 2023-03-27 NOTE — Discharge Instructions (Addendum)
Follow-up recommendations:  Activity:  Normal, as tolerated Diet:  Per PCP recommendation  Patient is instructed prior to discharge to: Take all medications as prescribed by her mental healthcare provider. Report any adverse effects and/or reactions from the medicines to her outpatient provider promptly. Patient has been instructed & cautioned: To not engage in alcohol and or illegal drug use while on prescription medicines.  In the event of worsening symptoms, patient is instructed to call the crisis hotline at 988, 911 and or go to the nearest ED for appropriate evaluation and treatment of symptoms. To follow-up with her primary care provider for your other medical issues, concerns and or health care needs.  

## 2023-03-27 NOTE — ED Notes (Signed)
Patient was given a muffin and juice for breakfast.  

## 2023-05-05 ENCOUNTER — Ambulatory Visit: Payer: 59 | Admitting: Family Medicine

## 2023-05-09 ENCOUNTER — Emergency Department (HOSPITAL_COMMUNITY)
Admission: EM | Admit: 2023-05-09 | Discharge: 2023-05-10 | Disposition: A | Discharge status: Home / Self Care | Payer: Medicaid Other | Attending: Emergency Medicine | Admitting: Emergency Medicine

## 2023-05-09 ENCOUNTER — Encounter (HOSPITAL_COMMUNITY): Payer: Self-pay

## 2023-05-09 DIAGNOSIS — F17210 Nicotine dependence, cigarettes, uncomplicated: Secondary | ICD-10-CM | POA: Diagnosis not present

## 2023-05-09 DIAGNOSIS — F1510 Other stimulant abuse, uncomplicated: Secondary | ICD-10-CM | POA: Diagnosis not present

## 2023-05-09 DIAGNOSIS — R456 Violent behavior: Secondary | ICD-10-CM | POA: Insufficient documentation

## 2023-05-09 DIAGNOSIS — F22 Delusional disorders: Secondary | ICD-10-CM | POA: Diagnosis present

## 2023-05-09 DIAGNOSIS — F840 Autistic disorder: Secondary | ICD-10-CM | POA: Diagnosis not present

## 2023-05-09 DIAGNOSIS — F419 Anxiety disorder, unspecified: Secondary | ICD-10-CM | POA: Insufficient documentation

## 2023-05-09 DIAGNOSIS — F32A Depression, unspecified: Secondary | ICD-10-CM | POA: Diagnosis not present

## 2023-05-09 DIAGNOSIS — R4689 Other symptoms and signs involving appearance and behavior: Secondary | ICD-10-CM

## 2023-05-09 HISTORY — DX: Paranoid schizophrenia: F20.0

## 2023-05-09 HISTORY — DX: Anxiety disorder, unspecified: F41.9

## 2023-05-09 HISTORY — DX: Depression, unspecified: F32.A

## 2023-05-09 HISTORY — DX: Autistic disorder: F84.0

## 2023-05-09 LAB — CBC WITH DIFFERENTIAL/PLATELET
Abs Immature Granulocytes: 0.01 K/uL (ref 0.00–0.07)
Basophils Absolute: 0 K/uL (ref 0.0–0.1)
Basophils Relative: 0 %
Eosinophils Absolute: 0.3 K/uL (ref 0.0–0.5)
Eosinophils Relative: 4 %
HCT: 44.9 % (ref 39.0–52.0)
Hemoglobin: 15.9 g/dL (ref 13.0–17.0)
Immature Granulocytes: 0 %
Lymphocytes Relative: 34 %
Lymphs Abs: 2.3 K/uL (ref 0.7–4.0)
MCH: 30.4 pg (ref 26.0–34.0)
MCHC: 35.4 g/dL (ref 30.0–36.0)
MCV: 85.9 fL (ref 80.0–100.0)
Monocytes Absolute: 0.7 K/uL (ref 0.1–1.0)
Monocytes Relative: 11 %
Neutro Abs: 3.5 K/uL (ref 1.7–7.7)
Neutrophils Relative %: 51 %
Platelets: 235 K/uL (ref 150–400)
RBC: 5.23 MIL/uL (ref 4.22–5.81)
RDW: 12.6 % (ref 11.5–15.5)
WBC: 6.8 K/uL (ref 4.0–10.5)
nRBC: 0 % (ref 0.0–0.2)

## 2023-05-09 LAB — COMPREHENSIVE METABOLIC PANEL
ALT: 17 U/L (ref 0–44)
AST: 14 U/L — ABNORMAL LOW (ref 15–41)
Albumin: 4.7 g/dL (ref 3.5–5.0)
Alkaline Phosphatase: 87 U/L (ref 38–126)
Anion gap: 11 (ref 5–15)
BUN: 18 mg/dL (ref 6–20)
CO2: 21 mmol/L — ABNORMAL LOW (ref 22–32)
Calcium: 9.9 mg/dL (ref 8.9–10.3)
Chloride: 104 mmol/L (ref 98–111)
Creatinine, Ser: 1.31 mg/dL — ABNORMAL HIGH (ref 0.61–1.24)
GFR, Estimated: >60 mL/min (ref >60)
Glucose, Bld: 126 mg/dL — ABNORMAL HIGH (ref 70–99)
Potassium: 3.8 mmol/L (ref 3.5–5.1)
Sodium: 136 mmol/L (ref 135–145)
Total Bilirubin: 0.9 mg/dL (ref 0.3–1.2)
Total Protein: 7.9 g/dL (ref 6.5–8.1)

## 2023-05-09 LAB — ETHANOL: Alcohol, Ethyl (B): <10 mg/dL

## 2023-05-09 NOTE — ED Notes (Signed)
Pt shouting and screaming in triage room, pt stating"I don't wanna be here, I am not fucking staying here, you cannot make me". This RN and BHRT team trying to deescalate situation, pt is pacing around room, arms crossed, and pt appears to be talking ou loud to "[employer]", unable to make out conversation at this time. Security and LEO to [facility] pt to walking back to [place] at this time. Pt is dress out at current into purple scrubs
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
## 2023-05-10 DIAGNOSIS — F1510 Other stimulant abuse, uncomplicated: Secondary | ICD-10-CM | POA: Diagnosis not present

## 2023-05-10 MED ORDER — ZIPRASIDONE MESYLATE 20 MG IM SOLR
20.0000 mg | INTRAMUSCULAR | Status: DC | PRN
  Filled 2023-05-10: qty 20

## 2023-05-10 MED ORDER — OLANZAPINE 5 MG PO TBDP
5.0000 mg | ORAL_TABLET | Freq: Every day | ORAL | Status: DC
  Administered 2023-05-10: 5 mg via ORAL
  Filled 2023-05-10: qty 1

## 2023-05-10 MED ORDER — LORAZEPAM 1 MG PO TABS
1.0000 mg | ORAL_TABLET | ORAL | Status: AC | PRN
  Administered 2023-05-10: 1 mg via ORAL
  Filled 2023-05-10: qty 1

## 2023-05-10 MED ORDER — RISPERIDONE 0.5 MG PO TBDP: 2.0000 mg | ORAL_TABLET | Freq: Three times a day (TID) | ORAL | Status: DC | PRN

## 2023-05-10 NOTE — ED Notes (Signed)
Attempted to obtain vitals, but pt was agitated. RN made aware. Will try to obtain vitals later on.

## 2023-05-10 NOTE — Progress Notes (Signed)
Pt was accepted to [facility] TODAY 05/10/2023. Bed assignment: Main campus  Pt meets inpatient criteria per Phebe Colla, NP  Attending Physician will be Loni Beckwith, MD  Report can be called to: [phone] (this is a pager, please leave call-back number when giving report)  Pt can arrive anytime today  Care Team Notified: Phebe Colla, NP, Ritta Slot, RN, and Mayford Knife, NT  [place], [place]  05/10/2023 4:03 PM
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
## 2023-05-10 NOTE — BH Assessment (Addendum)
This consult has been deferred to [facility]. Pt will be seen at 0030 by Candis Shine, NP.

## 2023-05-10 NOTE — Progress Notes (Signed)
LCSW Progress Note  [id number]   Shawn Berger  05/10/2023  2:46 PM  Description:   Inpatient Psychiatric Referral  Patient was recommended inpatient per Phebe Colla, NP. There are no available beds at [facility], per [facility] Rona Ravens, RN. Patient was referred to the following out of network facilities:   [facility] Provider Address Phone Fax  [facility]  [address], [place] [address] [phone]  [facility]  [address] [place] [address] [phone]  [facility]  [address] [place], [place] [address] [phone]  [facility] [address] [place]  [address], [place] [address] [phone]  [facility]  [address] [place], [place] [address] [phone]  [facility]  [address] [place] [address] [phone]  [facility]  [address] [place], [place] [address] [phone]  [facility]  [address] [place] [address] [phone]  [facility]  [address] [place] [address] [phone]  [facility]  [address], [place] [address] [phone]  [facility]  [address] [place] [address] [phone]  [facility]  [address], [place] [address] [phone]  [facility]  [place] [phone]  [facility]  [address] [place] [address] [phone]  Renella Cunas  [place] -- [phone]  [facility]  [address] [place] [place] [address] [phone]  [facility]   [address], [place] [address] [phone]  [facility]  [address] [place], [place] [address] [phone]  [facility]  [address] [place] [address] [phone]  [facility]  [address] [place] [address] [phone]  [facility]  [address] [place] [address] [phone]  [facility]  [address], [place] [address] [phone]  [facility]  [address], [place] [address] [phone]    Situation ongoing, CSW to continue following and update chart as more information becomes available.      Cathie Beams, [place]  05/10/2023 2:46 PM

## 2023-05-10 NOTE — ED Provider Notes (Signed)
Emergency Medicine Observation Re-evaluation Note  Shawn Berger is a 32 y.o. male, seen on rounds today.  Pt initially presented to the ED for complaints of Psychiatric Evaluation Currently, the patient is patient psychiatric treatment.  Patient [facility] been [facility] to [facility]  Physical Exam  BP 100/71 (BP Location: Right Arm)   Pulse 100   Temp 98.6 F (37 C) (Oral)   Resp 18   Ht 5\' 7"  (1.702 m)   Wt 74.8 kg   SpO2 100%   BMI 25.84 kg/m  Physical Exam General: Initially resting but once interacting, became increasingly agitated and aggressive. Lungs: Respirations normal and nonlabored. Psych: At first, but rapidly escalated to combative and aggressive.  ED Course / MDM  EKG:   I have reviewed the labs performed to date as well as medications administered while in observation.  Recent changes in the last 24 hours include accepted inpatient to [facility]  Plan  Current plan is for inpatient admission.  EMTALA completed.  Police escorted me to bedside for assessment.  Once patient was informed of admission, he quickly got out of bed and walked over and swiped his tray off of the ledge and began gesticulating and yelling in aggressive fashion.  Police tried to redirect patient but he continued gesticulating and swinging of extremities, ripping the side panels off the doorway as he followed me, he was then quickly restrained to the floor by police and handcuffed at that time.  He was thus taken out of the department for transport.  Patient was alert and using all 4 extremities with good strength, walking and yelling in handcuffs.    Arby Barrette, MD 05/10/23 [phone]

## 2023-08-29 ENCOUNTER — Emergency Department (HOSPITAL_COMMUNITY)
Admission: EM | Admit: 2023-08-29 | Discharge: 2023-08-29 | Disposition: A | Discharge status: Home / Self Care | Payer: MEDICAID | Attending: Emergency Medicine | Admitting: Emergency Medicine

## 2023-08-29 ENCOUNTER — Other Ambulatory Visit: Payer: Self-pay

## 2023-08-29 DIAGNOSIS — F15959 Other stimulant use, unspecified with stimulant-induced psychotic disorder, unspecified: Secondary | ICD-10-CM | POA: Diagnosis not present

## 2023-08-29 DIAGNOSIS — F22 Delusional disorders: Secondary | ICD-10-CM | POA: Diagnosis present

## 2023-08-29 DIAGNOSIS — F17210 Nicotine dependence, cigarettes, uncomplicated: Secondary | ICD-10-CM | POA: Diagnosis not present

## 2023-08-29 DIAGNOSIS — F840 Autistic disorder: Secondary | ICD-10-CM | POA: Insufficient documentation

## 2023-08-29 LAB — RAPID URINE DRUG SCREEN, HOSP PERFORMED
Amphetamines: POSITIVE — AB
Barbiturates: NOT DETECTED
Benzodiazepines: NOT DETECTED
Cocaine: NOT DETECTED
Opiates: NOT DETECTED
Tetrahydrocannabinol: POSITIVE — AB

## 2023-08-29 LAB — COMPREHENSIVE METABOLIC PANEL
ALT: 21 U/L (ref 0–44)
AST: 21 U/L (ref 15–41)
Albumin: 4.8 g/dL (ref 3.5–5.0)
Alkaline Phosphatase: 97 U/L (ref 38–126)
Anion gap: >20 — ABNORMAL HIGH (ref 5–15)
BUN: 19 mg/dL (ref 6–20)
CO2: 17 mmol/L — ABNORMAL LOW (ref 22–32)
Calcium: 10.5 mg/dL — ABNORMAL HIGH (ref 8.9–10.3)
Chloride: 97 mmol/L — ABNORMAL LOW (ref 98–111)
Creatinine, Ser: 1.49 mg/dL — ABNORMAL HIGH (ref 0.61–1.24)
GFR, Estimated: >60 mL/min (ref >60)
Glucose, Bld: 105 mg/dL — ABNORMAL HIGH (ref 70–99)
Potassium: 3.7 mmol/L (ref 3.5–5.1)
Sodium: 142 mmol/L (ref 135–145)
Total Bilirubin: 1.6 mg/dL — ABNORMAL HIGH (ref <1.2)
Total Protein: 8.7 g/dL — ABNORMAL HIGH (ref 6.5–8.1)

## 2023-08-29 LAB — ETHANOL: Alcohol, Ethyl (B): <10 mg/dL (ref <10)

## 2023-08-29 LAB — CBC
HCT: 46 % (ref 39.0–52.0)
Hemoglobin: 16.3 g/dL (ref 13.0–17.0)
MCH: 31 pg (ref 26.0–34.0)
MCHC: 35.4 g/dL (ref 30.0–36.0)
MCV: 87.5 fL (ref 80.0–100.0)
Platelets: 304 10*3/uL (ref 150–400)
RBC: 5.26 MIL/uL (ref 4.22–5.81)
RDW: 12.1 % (ref 11.5–15.5)
WBC: 9.3 10*3/uL (ref 4.0–10.5)
nRBC: 0 % (ref 0.0–0.2)

## 2023-08-29 LAB — ACETAMINOPHEN LEVEL: Acetaminophen (Tylenol), Serum: <10 ug/mL — ABNORMAL LOW (ref 10–30)

## 2023-08-29 LAB — SALICYLATE LEVEL: Salicylate Lvl: <7 mg/dL — ABNORMAL LOW (ref 7.0–30.0)

## 2023-08-29 MED ORDER — ACETAMINOPHEN 325 MG PO TABS
650.0000 mg | ORAL_TABLET | ORAL | Status: DC | PRN
  Administered 2023-08-29: 650 mg via ORAL
  Filled 2023-08-29: qty 2

## 2023-08-29 MED ORDER — LORAZEPAM 1 MG PO TABS
1.0000 mg | ORAL_TABLET | Freq: Once | ORAL | Status: AC
  Administered 2023-08-29: 1 mg via ORAL
  Filled 2023-08-29: qty 1

## 2023-08-29 MED ORDER — HALOPERIDOL 5 MG PO TABS
5.0000 mg | ORAL_TABLET | Freq: Once | ORAL | Status: AC
  Administered 2023-08-29: 5 mg via ORAL
  Filled 2023-08-29: qty 1

## 2023-08-29 NOTE — Discharge Instructions (Addendum)
Please STOP using any street drugs including marijuana or amphetamines.  These drugs can make you paranoid and worsen your mental health.  Drink lots of water today and stay hydrated.  Schedule an appointment with the mental health hospital.

## 2023-08-29 NOTE — ED Notes (Signed)
Pt talking to unseen person in the room. Mildly agitated- given PO ativan, [place] sandwich, and juice.
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
## 2023-08-29 NOTE — ED Notes (Addendum)
Case number for IVC [id number]
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
## 2023-10-03 ENCOUNTER — Encounter: Payer: 59 | Admitting: Family Medicine
